# Patient Record
Sex: Female | Born: 1985 | Race: White | Hispanic: No | Marital: Single | Smoking: Former smoker
Health system: Southern US, Community
[De-identification: ages and names within clinical notes are randomized; demographics above are authoritative.]

## PROBLEM LIST (undated history)

## (undated) ENCOUNTER — Inpatient Hospital Stay (HOSPITAL_COMMUNITY): Payer: Self-pay

## (undated) DIAGNOSIS — Z8619 Personal history of other infectious and parasitic diseases: Secondary | ICD-10-CM

## (undated) DIAGNOSIS — F17200 Nicotine dependence, unspecified, uncomplicated: Secondary | ICD-10-CM

## (undated) DIAGNOSIS — IMO0002 Reserved for concepts with insufficient information to code with codable children: Secondary | ICD-10-CM

## (undated) DIAGNOSIS — A630 Anogenital (venereal) warts: Secondary | ICD-10-CM

## (undated) DIAGNOSIS — F410 Panic disorder [episodic paroxysmal anxiety] without agoraphobia: Secondary | ICD-10-CM

## (undated) DIAGNOSIS — R87619 Unspecified abnormal cytological findings in specimens from cervix uteri: Secondary | ICD-10-CM

## (undated) HISTORY — DX: Nicotine dependence, unspecified, uncomplicated: F17.200

## (undated) HISTORY — DX: Reserved for concepts with insufficient information to code with codable children: IMO0002

## (undated) HISTORY — PX: CRYOTHERAPY: SHX1416

## (undated) HISTORY — DX: Unspecified abnormal cytological findings in specimens from cervix uteri: R87.619

## (undated) HISTORY — PX: MANDIBLE SURGERY: SHX707

## (undated) HISTORY — DX: Personal history of other infectious and parasitic diseases: Z86.19

## (undated) HISTORY — DX: Anogenital (venereal) warts: A63.0

---
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
## 2005-01-28 ENCOUNTER — Emergency Department (HOSPITAL_COMMUNITY): Admission: EM | Admit: 2005-01-28 | Discharge: 2005-01-28 | Payer: Self-pay | Admitting: Emergency Medicine

## 2005-11-14 ENCOUNTER — Emergency Department (HOSPITAL_COMMUNITY): Admission: EM | Admit: 2005-11-14 | Discharge: 2005-11-14 | Payer: Self-pay | Admitting: Emergency Medicine

## 2006-10-16 ENCOUNTER — Other Ambulatory Visit: Admission: RE | Admit: 2006-10-16 | Discharge: 2006-10-16 | Payer: Self-pay | Admitting: Family Medicine

## 2006-10-16 ENCOUNTER — Ambulatory Visit: Payer: Self-pay | Admitting: Family Medicine

## 2006-10-21 ENCOUNTER — Ambulatory Visit: Payer: Self-pay | Admitting: Family Medicine

## 2006-11-04 ENCOUNTER — Ambulatory Visit: Payer: Self-pay | Admitting: Family Medicine

## 2007-01-09 ENCOUNTER — Ambulatory Visit: Payer: Self-pay | Admitting: Family Medicine

## 2007-03-11 ENCOUNTER — Ambulatory Visit: Payer: Self-pay | Admitting: Family Medicine

## 2007-09-10 ENCOUNTER — Ambulatory Visit: Payer: Self-pay | Admitting: Family Medicine

## 2008-11-26 ENCOUNTER — Emergency Department (HOSPITAL_COMMUNITY): Admission: EM | Admit: 2008-11-26 | Discharge: 2008-11-27 | Payer: Self-pay | Admitting: Emergency Medicine

## 2008-11-29 ENCOUNTER — Ambulatory Visit: Payer: Self-pay | Admitting: Family Medicine

## 2008-12-06 ENCOUNTER — Ambulatory Visit: Payer: Self-pay | Admitting: Family Medicine

## 2009-01-05 ENCOUNTER — Ambulatory Visit: Payer: Self-pay | Admitting: Family Medicine

## 2009-06-08 ENCOUNTER — Emergency Department (HOSPITAL_COMMUNITY): Admission: EM | Admit: 2009-06-08 | Discharge: 2009-06-08 | Payer: Self-pay | Admitting: Emergency Medicine

## 2009-06-28 ENCOUNTER — Ambulatory Visit: Payer: Self-pay | Admitting: Family Medicine

## 2009-07-24 ENCOUNTER — Ambulatory Visit: Payer: Self-pay | Admitting: Physician Assistant

## 2009-08-28 ENCOUNTER — Inpatient Hospital Stay (HOSPITAL_COMMUNITY): Admission: AD | Admit: 2009-08-28 | Discharge: 2009-08-28 | Payer: Self-pay | Admitting: Obstetrics and Gynecology

## 2010-01-29 ENCOUNTER — Inpatient Hospital Stay (HOSPITAL_COMMUNITY): Admission: AD | Admit: 2010-01-29 | Discharge: 2010-01-29 | Payer: Self-pay | Admitting: Obstetrics and Gynecology

## 2010-02-03 ENCOUNTER — Inpatient Hospital Stay (HOSPITAL_COMMUNITY): Admission: AD | Admit: 2010-02-03 | Discharge: 2010-02-03 | Payer: Self-pay | Admitting: Obstetrics and Gynecology

## 2010-02-25 ENCOUNTER — Inpatient Hospital Stay (HOSPITAL_COMMUNITY): Admission: AD | Admit: 2010-02-25 | Discharge: 2010-02-26 | Payer: Self-pay | Admitting: Obstetrics and Gynecology

## 2010-02-26 ENCOUNTER — Inpatient Hospital Stay (HOSPITAL_COMMUNITY): Admission: AD | Admit: 2010-02-26 | Discharge: 2010-02-28 | Payer: Self-pay | Admitting: Obstetrics and Gynecology

## 2010-02-26 ENCOUNTER — Encounter (INDEPENDENT_AMBULATORY_CARE_PROVIDER_SITE_OTHER): Payer: Self-pay | Admitting: Obstetrics and Gynecology

## 2010-07-25 LAB — CBC
HCT: 31.7 % — ABNORMAL LOW (ref 36.0–46.0)
HCT: 36.2 % (ref 36.0–46.0)
Hemoglobin: 11.9 g/dL — ABNORMAL LOW (ref 12.0–15.0)
Hemoglobin: 12.4 g/dL (ref 12.0–15.0)
MCH: 33 pg (ref 26.0–34.0)
MCH: 33 pg (ref 26.0–34.0)
MCHC: 34.4 g/dL (ref 30.0–36.0)
MCV: 96 fL (ref 78.0–100.0)
MCV: 96.4 fL (ref 78.0–100.0)
MCV: 97.1 fL (ref 78.0–100.0)
Platelets: 107 10*3/uL — ABNORMAL LOW (ref 150–400)
RBC: 3.59 MIL/uL — ABNORMAL LOW (ref 3.87–5.11)
RBC: 3.77 MIL/uL — ABNORMAL LOW (ref 3.87–5.11)
RDW: 13.6 % (ref 11.5–15.5)
RDW: 14 % (ref 11.5–15.5)
WBC: 12.2 10*3/uL — ABNORMAL HIGH (ref 4.0–10.5)
WBC: 16.1 10*3/uL — ABNORMAL HIGH (ref 4.0–10.5)

## 2010-07-29 LAB — URINE MICROSCOPIC-ADD ON

## 2010-07-29 LAB — URINALYSIS, ROUTINE W REFLEX MICROSCOPIC
Bilirubin Urine: NEGATIVE
Glucose, UA: NEGATIVE mg/dL
Specific Gravity, Urine: 1.014 (ref 1.005–1.030)
Urobilinogen, UA: 0.2 mg/dL (ref 0.0–1.0)
pH: 6 (ref 5.0–8.0)

## 2010-07-29 LAB — POCT PREGNANCY, URINE: Preg Test, Ur: NEGATIVE

## 2010-07-31 LAB — WET PREP, GENITAL
Trich, Wet Prep: NONE SEEN
Yeast Wet Prep HPF POC: NONE SEEN

## 2010-07-31 LAB — URINALYSIS, ROUTINE W REFLEX MICROSCOPIC
Ketones, ur: NEGATIVE mg/dL
Leukocytes, UA: NEGATIVE
Nitrite: NEGATIVE
Protein, ur: NEGATIVE mg/dL
Urobilinogen, UA: 0.2 mg/dL (ref 0.0–1.0)

## 2010-07-31 LAB — GC/CHLAMYDIA PROBE AMP, GENITAL: GC Probe Amp, Genital: NEGATIVE

## 2010-07-31 LAB — CBC
HCT: 37.1 % (ref 36.0–46.0)
Hemoglobin: 12.7 g/dL (ref 12.0–15.0)
MCHC: 34.2 g/dL (ref 30.0–36.0)
RDW: 13.2 % (ref 11.5–15.5)

## 2010-07-31 LAB — URINE CULTURE

## 2011-05-14 NOTE — L&D Delivery Note (Signed)
After admission pt rapidly progressed to C/C/+2.  She pushed briefly and had a SVD of one live viable white female infant over intact perineum in ROA position. Placenta S/I. EBL - 400cc. Baby to NBN.

## 2011-06-06 ENCOUNTER — Encounter: Payer: Self-pay | Admitting: Internal Medicine

## 2011-06-07 ENCOUNTER — Ambulatory Visit: Payer: Self-pay | Admitting: Medical

## 2011-06-17 ENCOUNTER — Other Ambulatory Visit: Payer: Self-pay | Admitting: Obstetrics and Gynecology

## 2011-06-17 LAB — OB RESULTS CONSOLE GC/CHLAMYDIA
Chlamydia: NEGATIVE
Gonorrhea: NEGATIVE

## 2011-06-17 LAB — OB RESULTS CONSOLE HEPATITIS B SURFACE ANTIGEN: Hepatitis B Surface Ag: NEGATIVE

## 2011-06-17 LAB — OB RESULTS CONSOLE RPR: RPR: NONREACTIVE

## 2011-06-17 LAB — OB RESULTS CONSOLE RUBELLA ANTIBODY, IGM: Rubella: IMMUNE

## 2011-11-09 ENCOUNTER — Inpatient Hospital Stay (HOSPITAL_COMMUNITY)
Admission: AD | Admit: 2011-11-09 | Discharge: 2011-11-09 | Disposition: A | Discharge status: Home / Self Care | Payer: Medicaid Other | Source: Ambulatory Visit | Attending: Obstetrics & Gynecology | Admitting: Obstetrics & Gynecology

## 2011-11-09 ENCOUNTER — Encounter (HOSPITAL_COMMUNITY): Payer: Self-pay

## 2011-11-09 DIAGNOSIS — O99891 Other specified diseases and conditions complicating pregnancy: Secondary | ICD-10-CM | POA: Insufficient documentation

## 2011-11-09 DIAGNOSIS — N949 Unspecified condition associated with female genital organs and menstrual cycle: Secondary | ICD-10-CM | POA: Insufficient documentation

## 2011-11-09 DIAGNOSIS — O26899 Other specified pregnancy related conditions, unspecified trimester: Secondary | ICD-10-CM

## 2011-11-09 DIAGNOSIS — N898 Other specified noninflammatory disorders of vagina: Secondary | ICD-10-CM

## 2011-11-09 LAB — POCT FERN TEST: Fern Test: NEGATIVE

## 2011-11-09 NOTE — Discharge Instructions (Signed)
The exam we did to check for rupture of membranes was negative. I spoke with Dr. Arlyce Dice. We feel that you may go home but should rest and follow up in the office. If you have another episode of leaking fluid you should call Dr. Arlyce Dice or return here. If you have contractions, fever, dizziness or other problems, return.

## 2011-11-09 NOTE — MAU Note (Signed)
Had a gush of fld about 5-6hrs ago and has happened twice now. Fld was clear. Has had occ. Pain in lower abd that comes and goes but does not feel like a contracton.

## 2011-11-09 NOTE — MAU Provider Note (Signed)
SENG FOUTS is a 26 y.o. G3, P1102 @ [redacted]w[redacted]d gestation who presents to MAU for ? ROM. She reports leaking fluid approximately 5 hours prior to arrival to MAU. Happened  2 separate occasions. Last sexual intercourse earlier today.   Exam: 26 y.o. female in no acute distress, alert and oriented. Spec. Exam: white discharge vaginal vault. No pooling noted.   BP 139/56  Pulse 95  Temp 98.5 F (36.9 C) (Oral)  Resp 20  Ht 5\' 1"  (1.549 m)  Wt 176 lb 3.2 oz (79.924 kg)  BMI 33.29 kg/m2  EFM: Baseline FH 145, no decelerations, reactive tracing, no contractions.   Slide obtained to examine for fern and is negative.  I discussed the clinical and lab findings with Dr. Arlyce Dice.   We will discharge the patient home and if she has any problems she will call Dr. Arlyce Dice.   I have reviewed this patient's vital signs, nurses notes and appropriate labs. I have discussed with the patient and her family results of the fern slide and the fact that this does not indicate rupture of membranes. I discussed with the patient need for pelvic rest. If she has another episode of leaking fluid, contractions or any problems she will call Dr. Arlyce Dice or return here. Written instructions given to the patient.

## 2011-11-09 NOTE — MAU Note (Signed)
FHM strip reviewed

## 2011-12-11 ENCOUNTER — Telehealth (HOSPITAL_COMMUNITY): Payer: Self-pay | Admitting: *Deleted

## 2011-12-11 ENCOUNTER — Encounter (HOSPITAL_COMMUNITY): Payer: Self-pay | Admitting: *Deleted

## 2011-12-11 NOTE — Telephone Encounter (Signed)
Preadmission screenPreadmission screen 

## 2011-12-27 ENCOUNTER — Encounter (HOSPITAL_COMMUNITY): Payer: Self-pay | Admitting: *Deleted

## 2011-12-27 ENCOUNTER — Encounter (HOSPITAL_COMMUNITY): Payer: Self-pay | Admitting: Anesthesiology

## 2011-12-27 ENCOUNTER — Inpatient Hospital Stay (HOSPITAL_COMMUNITY): Payer: Medicaid Other | Admitting: Anesthesiology

## 2011-12-27 ENCOUNTER — Inpatient Hospital Stay (HOSPITAL_COMMUNITY)
Admission: AD | Admit: 2011-12-27 | Discharge: 2011-12-29 | DRG: 775 | Disposition: A | Discharge status: Home / Self Care | Payer: Medicaid Other | Source: Ambulatory Visit | Attending: Obstetrics and Gynecology | Admitting: Obstetrics and Gynecology

## 2011-12-27 DIAGNOSIS — Z3480 Encounter for supervision of other normal pregnancy, unspecified trimester: Secondary | ICD-10-CM

## 2011-12-27 LAB — CBC
HCT: 33.1 % — ABNORMAL LOW (ref 36.0–46.0)
MCV: 89.7 fL (ref 78.0–100.0)
RBC: 3.69 MIL/uL — ABNORMAL LOW (ref 3.87–5.11)
WBC: 11.6 10*3/uL — ABNORMAL HIGH (ref 4.0–10.5)

## 2011-12-27 MED ORDER — EPHEDRINE 5 MG/ML INJ
10.0000 mg | INTRAVENOUS | Status: DC | PRN
  Filled 2011-12-27: qty 4

## 2011-12-27 MED ORDER — EPHEDRINE 5 MG/ML INJ: 10.0000 mg | INTRAVENOUS | Status: DC | PRN

## 2011-12-27 MED ORDER — LIDOCAINE HCL (PF) 1 % IJ SOLN
30.0000 mL | INTRAMUSCULAR | Status: DC | PRN
  Filled 2011-12-27: qty 30

## 2011-12-27 MED ORDER — SIMETHICONE 80 MG PO CHEW: 80.0000 mg | CHEWABLE_TABLET | ORAL | Status: DC | PRN

## 2011-12-27 MED ORDER — ONDANSETRON HCL 4 MG PO TABS: 4.0000 mg | ORAL_TABLET | ORAL | Status: DC | PRN

## 2011-12-27 MED ORDER — DIPHENHYDRAMINE HCL 50 MG/ML IJ SOLN: 12.5000 mg | INTRAMUSCULAR | Status: DC | PRN

## 2011-12-27 MED ORDER — OXYCODONE-ACETAMINOPHEN 5-325 MG PO TABS
1.0000 | ORAL_TABLET | ORAL | Status: DC | PRN
  Administered 2011-12-27: 1 via ORAL
  Filled 2011-12-27: qty 1

## 2011-12-27 MED ORDER — OXYTOCIN 40 UNITS IN LACTATED RINGERS INFUSION - SIMPLE MED
62.5000 mL/h | Freq: Once | INTRAVENOUS | Status: AC
  Administered 2011-12-27: 62.5 mL/h via INTRAVENOUS
  Filled 2011-12-27: qty 1000

## 2011-12-27 MED ORDER — OXYCODONE-ACETAMINOPHEN 5-325 MG PO TABS
1.0000 | ORAL_TABLET | ORAL | Status: DC | PRN
  Administered 2011-12-28 (×5): 1 via ORAL
  Filled 2011-12-27 (×7): qty 1

## 2011-12-27 MED ORDER — ONDANSETRON HCL 4 MG/2ML IJ SOLN: 4.0000 mg | INTRAMUSCULAR | Status: DC | PRN

## 2011-12-27 MED ORDER — ONDANSETRON HCL 4 MG/2ML IJ SOLN: 4.0000 mg | Freq: Four times a day (QID) | INTRAMUSCULAR | Status: DC | PRN

## 2011-12-27 MED ORDER — MEASLES, MUMPS & RUBELLA VAC ~~LOC~~ INJ: 0.5000 mL | INJECTION | Freq: Once | SUBCUTANEOUS | Status: DC

## 2011-12-27 MED ORDER — ACETAMINOPHEN 325 MG PO TABS: 650.0000 mg | ORAL_TABLET | ORAL | Status: DC | PRN

## 2011-12-27 MED ORDER — ZOLPIDEM TARTRATE 5 MG PO TABS: 5.0000 mg | ORAL_TABLET | Freq: Every evening | ORAL | Status: DC | PRN

## 2011-12-27 MED ORDER — PHENYLEPHRINE 40 MCG/ML (10ML) SYRINGE FOR IV PUSH (FOR BLOOD PRESSURE SUPPORT): 80.0000 ug | PREFILLED_SYRINGE | INTRAVENOUS | Status: DC | PRN

## 2011-12-27 MED ORDER — FENTANYL 2.5 MCG/ML BUPIVACAINE 1/10 % EPIDURAL INFUSION (WH - ANES)
14.0000 mL/h | INTRAMUSCULAR | Status: DC
  Administered 2011-12-27: 14 mL/h via EPIDURAL
  Filled 2011-12-27: qty 60

## 2011-12-27 MED ORDER — IBUPROFEN 600 MG PO TABS
600.0000 mg | ORAL_TABLET | Freq: Four times a day (QID) | ORAL | Status: DC | PRN
  Administered 2011-12-27: 600 mg via ORAL
  Filled 2011-12-27: qty 1

## 2011-12-27 MED ORDER — WITCH HAZEL-GLYCERIN EX PADS: 1.0000 "application " | MEDICATED_PAD | CUTANEOUS | Status: DC | PRN

## 2011-12-27 MED ORDER — LACTATED RINGERS IV SOLN: 500.0000 mL | Freq: Once | INTRAVENOUS | Status: DC

## 2011-12-27 MED ORDER — IBUPROFEN 600 MG PO TABS
600.0000 mg | ORAL_TABLET | Freq: Four times a day (QID) | ORAL | Status: DC
  Administered 2011-12-28 – 2011-12-29 (×6): 600 mg via ORAL
  Filled 2011-12-27 (×6): qty 1

## 2011-12-27 MED ORDER — FLEET ENEMA 7-19 GM/118ML RE ENEM: 1.0000 | ENEMA | RECTAL | Status: DC | PRN

## 2011-12-27 MED ORDER — BENZOCAINE-MENTHOL 20-0.5 % EX AERO: 1.0000 "application " | INHALATION_SPRAY | CUTANEOUS | Status: DC | PRN

## 2011-12-27 MED ORDER — TETANUS-DIPHTH-ACELL PERTUSSIS 5-2.5-18.5 LF-MCG/0.5 IM SUSP
0.5000 mL | Freq: Once | INTRAMUSCULAR | Status: AC
  Administered 2011-12-28: 0.5 mL via INTRAMUSCULAR
  Filled 2011-12-27: qty 0.5

## 2011-12-27 MED ORDER — LIDOCAINE HCL (PF) 1 % IJ SOLN
INTRAMUSCULAR | Status: DC | PRN
  Administered 2011-12-27 (×2): 5 mL

## 2011-12-27 MED ORDER — PHENYLEPHRINE 40 MCG/ML (10ML) SYRINGE FOR IV PUSH (FOR BLOOD PRESSURE SUPPORT)
80.0000 ug | PREFILLED_SYRINGE | INTRAVENOUS | Status: DC | PRN
  Filled 2011-12-27: qty 5

## 2011-12-27 MED ORDER — CITRIC ACID-SODIUM CITRATE 334-500 MG/5ML PO SOLN: 30.0000 mL | ORAL | Status: DC | PRN

## 2011-12-27 MED ORDER — LACTATED RINGERS IV SOLN
INTRAVENOUS | Status: DC
  Administered 2011-12-27: 19:00:00 via INTRAVENOUS

## 2011-12-27 MED ORDER — DIBUCAINE 1 % RE OINT: 1.0000 "application " | TOPICAL_OINTMENT | RECTAL | Status: DC | PRN

## 2011-12-27 MED ORDER — LACTATED RINGERS IV SOLN
500.0000 mL | INTRAVENOUS | Status: DC | PRN
Start: 2011-12-27 — End: 2011-12-27

## 2011-12-27 MED ORDER — OXYTOCIN BOLUS FROM INFUSION
250.0000 mL | Freq: Once | INTRAVENOUS | Status: DC
  Filled 2011-12-27: qty 500

## 2011-12-27 NOTE — Anesthesia Procedure Notes (Signed)
Epidural Patient location during procedure: OB Start time: 12/27/2011 8:11 PM  Staffing Anesthesiologist: Brayton Caves R Performed by: anesthesiologist   Preanesthetic Checklist Completed: patient identified, site marked, surgical consent, pre-op evaluation, timeout performed, IV checked, risks and benefits discussed and monitors and equipment checked  Epidural Patient position: sitting Prep: site prepped and draped and DuraPrep Patient monitoring: continuous pulse ox and blood pressure Approach: midline Injection technique: LOR air and LOR saline  Needle:  Needle type: Tuohy  Needle gauge: 17 G Needle length: 9 cm Needle insertion depth: 6 cm Catheter type: closed end flexible Catheter size: 19 Gauge Catheter at skin depth: 11 cm Test dose: negative  Assessment Events: blood not aspirated, injection not painful, no injection resistance, negative IV test and no paresthesia  Additional Notes Patient identified.  Risk benefits discussed including failed block, incomplete pain control, headache, nerve damage, paralysis, blood pressure changes, nausea, vomiting, reactions to medication both toxic or allergic, and postpartum back pain.  Patient expressed understanding and wished to proceed.  All questions were answered.  Sterile technique used throughout procedure and epidural site dressed with sterile barrier dressing. No paresthesia or other complications noted.The patient did not experience any signs of intravascular injection such as tinnitus or metallic taste in mouth nor signs of intrathecal spread such as rapid motor block. Please see nursing notes for vital signs.

## 2011-12-27 NOTE — Anesthesia Preprocedure Evaluation (Signed)

## 2011-12-27 NOTE — MAU Note (Signed)
Dr. Dareen Piano notified pt's cervix now 6cm, orders to admit, gbs negative, may have epidural.

## 2011-12-27 NOTE — H&P (Signed)
Pt is a 25 year old white female, Nancy Palmer, at term who presents to the hospital in labor. On admission pt was 4cm. PNC was uncomplicated.  PMHx: Please read [facility] PE: VSSAF        HEENT- wnl        ABD- gravid IMP/ IUP at term         Labor Plan/ Admit

## 2011-12-27 NOTE — MAU Note (Signed)
Patient states she is having contractions every 5-10 minutes with no bleeding or leaking.

## 2011-12-27 NOTE — MAU Note (Signed)
Pt states, " I've had regular contractions since 3 pm, and they are every five to ten minutes."

## 2011-12-28 LAB — CBC
MCH: 28.8 pg (ref 26.0–34.0)
MCV: 90.3 fL (ref 78.0–100.0)
Platelets: 135 10*3/uL — ABNORMAL LOW (ref 150–400)
RDW: 14.7 % (ref 11.5–15.5)

## 2011-12-28 NOTE — Anesthesia Postprocedure Evaluation (Signed)
  Anesthesia Post-op Note  Patient: Nancy Palmer  Procedure(s) Performed: * No procedures listed *  Patient Location: Mother/Baby  Anesthesia Type: Epidural  Level of Consciousness: awake, alert  and oriented  Airway and Oxygen Therapy: Patient Spontanous Breathing  Post-op Pain: mild  Post-op Assessment: Patient's Cardiovascular Status Stable, Respiratory Function Stable, Patent Airway and No signs of Nausea or vomiting  Post-op Vital Signs: stable  Complications: No apparent anesthesia complications

## 2011-12-28 NOTE — Progress Notes (Signed)
PPD#1 Pt without complaints. Lochia- mild. VSSAF IMP/ doing well PLAN/ Discharge in am.

## 2011-12-29 MED ORDER — IBUPROFEN 600 MG PO TABS: 600.0000 mg | ORAL_TABLET | Freq: Four times a day (QID) | ORAL | Status: AC | PRN

## 2011-12-29 MED ORDER — OXYCODONE-ACETAMINOPHEN 5-325 MG PO TABS: 1.0000 | ORAL_TABLET | ORAL | Status: AC | PRN

## 2011-12-29 NOTE — Discharge Summary (Signed)
Obstetric Discharge Summary Reason for Admission: onset of labor Prenatal Procedures: ultrasound Intrapartum Procedures: spontaneous vaginal delivery Postpartum Procedures: none Complications-Operative and Postpartum: none Hemoglobin  Date Value Range Status  12/28/2011 10.1* 12.0 - 15.0 g/dL Final     HCT  Date Value Range Status  12/28/2011 31.7* 36.0 - 46.0 % Final    Physical Exam:  General: alert Lochia: appropriate Uterine Fundus: firm  Discharge Diagnoses: Term Pregnancy-delivered  Discharge Information: Date: 12/29/2011 Activity: pelvic rest Diet: routine Medications: PNV, Ibuprofen and Percocet Condition: stable Instructions: refer to practice specific booklet Discharge to: home Follow-up Information    Follow up with Levi Aland, MD. Schedule an appointment as soon as possible for a visit in 4 weeks.   Contact information:   [address] [place] [address] [phone]          Newborn Data: Live born female  Birth Weight: 6 lb 13.9 oz (3115 g) APGAR: 9, 9  Home with mother.  Jannine Abreu E 12/29/2011, 10:25 AM

## 2011-12-30 NOTE — Progress Notes (Signed)
Post discharge chart review completed.  

## 2012-08-08 ENCOUNTER — Encounter (HOSPITAL_BASED_OUTPATIENT_CLINIC_OR_DEPARTMENT_OTHER): Payer: Self-pay | Admitting: *Deleted

## 2012-08-08 ENCOUNTER — Emergency Department (HOSPITAL_BASED_OUTPATIENT_CLINIC_OR_DEPARTMENT_OTHER)
Admission: EM | Admit: 2012-08-08 | Discharge: 2012-08-08 | Disposition: A | Discharge status: Home / Self Care | Payer: Self-pay | Attending: Emergency Medicine | Admitting: Emergency Medicine

## 2012-08-08 DIAGNOSIS — Y9389 Activity, other specified: Secondary | ICD-10-CM | POA: Insufficient documentation

## 2012-08-08 DIAGNOSIS — Y929 Unspecified place or not applicable: Secondary | ICD-10-CM | POA: Insufficient documentation

## 2012-08-08 DIAGNOSIS — S61219A Laceration without foreign body of unspecified finger without damage to nail, initial encounter: Secondary | ICD-10-CM

## 2012-08-08 DIAGNOSIS — W260XXA Contact with knife, initial encounter: Secondary | ICD-10-CM | POA: Insufficient documentation

## 2012-08-08 DIAGNOSIS — S61209A Unspecified open wound of unspecified finger without damage to nail, initial encounter: Secondary | ICD-10-CM | POA: Insufficient documentation

## 2012-08-08 DIAGNOSIS — Z8619 Personal history of other infectious and parasitic diseases: Secondary | ICD-10-CM | POA: Insufficient documentation

## 2012-08-08 DIAGNOSIS — Z87891 Personal history of nicotine dependence: Secondary | ICD-10-CM | POA: Insufficient documentation

## 2012-08-08 NOTE — ED Notes (Signed)
Pt states she cut her left middle finger with a knife last p.m. Bleeding controlled.

## 2012-08-08 NOTE — ED Provider Notes (Signed)
History     CSN: [id number]  Arrival date & time 08/08/12  1332   First MD Initiated Contact with Patient 08/08/12 1555      Chief Complaint  Patient presents with  . Laceration    (Consider location/radiation/quality/duration/timing/severity/associated sxs/prior treatment) Patient is a 27 y.o. female presenting with hand pain. The history is provided by the patient. No language interpreter was used.  Hand Pain This is a new problem. The current episode started yesterday. The problem occurs constantly. The problem has been gradually worsening. Nothing aggravates the symptoms. She has tried nothing for the symptoms. The treatment provided no relief.  Pt cut finger with a knife last night.  Pt reports area began bleeding again today.  Past Medical History  Diagnosis Date  . Smoker   . No pertinent past medical history   . H/O varicella   . HPV (human papilloma virus) anogenital infection   . Abnormal Pap smear     Past Surgical History  Procedure Laterality Date  . Mandible surgery      Family History  Problem Relation Age of Onset  . Other Neg Hx   . COPD Maternal Grandmother   . Diabetes Paternal Grandmother   . Heart disease Father   . COPD Paternal Grandfather     History  Substance Use Topics  . Smoking status: Former Smoker    Quit date: 12/11/2010  . Smokeless tobacco: Never Used  . Alcohol Use: No    OB History   Grav Para Term Preterm Abortions TAB SAB Ect Mult Living   3 3 2 1      3       Review of Systems  Skin: Positive for wound.    Allergies  Penicillins  Home Medications  No current outpatient prescriptions on file.  BP 125/81  Pulse 75  Temp(Src) 98.2 F (36.8 C) (Oral)  Resp 16  Ht 5\' 1"  (1.549 m)  Wt 155 lb (70.308 kg)  BMI 29.3 kg/m2  SpO2 100%  LMP 07/25/2012  Breastfeeding? No  Physical Exam  Nursing note and vitals reviewed. Constitutional: She is oriented to person, place, and time. She appears well-developed and  well-nourished.  Musculoskeletal:  Left 3rd finger 7mm laceration, no bleeding  nv and ns intact  Neurological: She is alert and oriented to person, place, and time. She has normal reflexes.  Skin: Skin is warm.  Psychiatric: She has a normal mood and affect.    ED Course  Procedures (including critical care time)  Labs Reviewed - No data to display No results found.   1. Laceration of finger of left hand, initial encounter       MDM  Steri strips placed to laceration,   Pt counseled on wound care        Elson Areas, PA-C 08/08/12 1634

## 2012-08-09 NOTE — ED Provider Notes (Signed)
Medical screening examination/treatment/procedure(s) were performed by non-physician practitioner and as supervising physician I was immediately available for consultation/collaboration.   Dashayla Theissen III, MD 08/09/12 1315 

## 2013-04-18 ENCOUNTER — Encounter (HOSPITAL_COMMUNITY): Payer: Self-pay | Admitting: *Deleted

## 2013-04-18 ENCOUNTER — Inpatient Hospital Stay (HOSPITAL_COMMUNITY)
Admission: AD | Admit: 2013-04-18 | Discharge: 2013-04-18 | Disposition: A | Discharge status: Home / Self Care | Payer: Medicaid Other | Source: Ambulatory Visit | Attending: Obstetrics & Gynecology | Admitting: Obstetrics & Gynecology

## 2013-04-18 DIAGNOSIS — O210 Mild hyperemesis gravidarum: Secondary | ICD-10-CM

## 2013-04-18 DIAGNOSIS — R109 Unspecified abdominal pain: Secondary | ICD-10-CM | POA: Insufficient documentation

## 2013-04-18 DIAGNOSIS — O219 Vomiting of pregnancy, unspecified: Secondary | ICD-10-CM

## 2013-04-18 HISTORY — DX: Panic disorder (episodic paroxysmal anxiety): F41.0

## 2013-04-18 LAB — URINALYSIS, ROUTINE W REFLEX MICROSCOPIC
Bilirubin Urine: NEGATIVE
Ketones, ur: 15 mg/dL — AB
Leukocytes, UA: NEGATIVE
Nitrite: NEGATIVE
Protein, ur: NEGATIVE mg/dL
Urobilinogen, UA: 1 mg/dL (ref 0.0–1.0)

## 2013-04-18 MED ORDER — ACETAMINOPHEN 325 MG PO TABS
650.0000 mg | ORAL_TABLET | Freq: Once | ORAL | Status: AC
  Administered 2013-04-18: 650 mg via ORAL
  Filled 2013-04-18: qty 2

## 2013-04-18 MED ORDER — PROMETHAZINE HCL 25 MG PO TABS: 25.0000 mg | ORAL_TABLET | Freq: Four times a day (QID) | ORAL | Status: DC | PRN

## 2013-04-18 MED ORDER — ONDANSETRON 8 MG PO TBDP
8.0000 mg | ORAL_TABLET | Freq: Once | ORAL | Status: AC
  Administered 2013-04-18: 8 mg via ORAL
  Filled 2013-04-18: qty 1

## 2013-04-18 NOTE — MAU Note (Signed)
Woke up this morning nauseated which is not unusual. I threw up once this am and had diarrhea once this am. Having aching in my hips

## 2013-04-18 NOTE — MAU Provider Note (Signed)
History     CSN: [id number]  Arrival date and time: 04/18/13 4098   First Provider Initiated Contact with Patient 04/18/13 0050      Chief Complaint  Patient presents with  . Abdominal Cramping   HPI Nancy Palmer 27 y.o. [redacted]w[redacted]d   Comes to MAU as she has been having continuous nausea.  Today she vomited x one.  She is having pain in the sides of her hips that goes to her lower abdomen.  Denies any leaking of fluid or vaginal bleeding.  Had diarrhea x one on Saturday.  Has not yet started prenatal care.  Is feeling weak due to the nausea.  OB History   Grav Para Term Preterm Abortions TAB SAB Ect Mult Living   4 3 2 1      3       Past Medical History  Diagnosis Date  . Smoker   . No pertinent past medical history   . H/O varicella   . HPV (human papilloma virus) anogenital infection   . Abnormal Pap smear   . Panic attack     Past Surgical History  Procedure Laterality Date  . Mandible surgery      Family History  Problem Relation Age of Onset  . Other Neg Hx   . COPD Maternal Grandmother   . Diabetes Paternal Grandmother   . Heart disease Father   . COPD Paternal Grandfather     History  Substance Use Topics  . Smoking status: Former Smoker    Quit date: 12/11/2010  . Smokeless tobacco: Never Used  . Alcohol Use: No    Allergies:  Allergies  Allergen Reactions  . Penicillins     Childhood reaction, unknown    Prescriptions prior to admission  Medication Sig Dispense Refill  . ibuprofen (ADVIL,MOTRIN) 200 MG tablet Take 200 mg by mouth every 6 (six) hours as needed.        Review of Systems  Constitutional: Negative for fever.  Gastrointestinal: Positive for nausea, vomiting, abdominal pain and diarrhea. Negative for constipation.  Genitourinary:       No vaginal discharge. No vaginal bleeding. No dysuria.   Physical Exam   Blood pressure 139/67, pulse 97, temperature 98.3 F (36.8 C), resp. rate 18, height 5\' 1"  (1.549 m), weight 180 lb 3.2  oz (81.738 kg), last menstrual period 01/30/2013.  Physical Exam  Nursing note and vitals reviewed. Constitutional: She is oriented to person, place, and time. She appears well-developed and well-nourished.  HENT:  Head: Normocephalic.  Eyes: EOM are normal.  Neck: Neck supple.  GI: Soft. There is no tenderness. There is no rebound and no guarding.  FHT heard with doppler 170.  Musculoskeletal: Normal range of motion.  No CVA tenderness.  Has some tenderness all across low back and seems ticklish with exam to flank areas, but no true CVA tenderness.  Neurological: She is alert and oriented to person, place, and time.  Skin: Skin is warm and dry.  Psychiatric: She has a normal mood and affect.    MAU Course  Procedures  MDM Results for orders placed during the hospital encounter of 04/18/13 (from the past 24 hour(s))  URINALYSIS, ROUTINE W REFLEX MICROSCOPIC     Status: Abnormal   Collection Time    04/18/13 12:27 AM      Result Value Range   Color, Urine YELLOW  YELLOW   APPearance CLOUDY (*) CLEAR   Specific Gravity, Urine >1.030 (*) 1.005 - 1.030  pH 6.0  5.0 - 8.0   Glucose, UA NEGATIVE  NEGATIVE mg/dL   Hgb urine dipstick NEGATIVE  NEGATIVE   Bilirubin Urine NEGATIVE  NEGATIVE   Ketones, ur 15 (*) NEGATIVE mg/dL   Protein, ur NEGATIVE  NEGATIVE mg/dL   Urobilinogen, UA 1.0  0.0 - 1.0 mg/dL   Nitrite NEGATIVE  NEGATIVE   Leukocytes, UA NEGATIVE  NEGATIVE  POCT PREGNANCY, URINE     Status: Abnormal   Collection Time    04/18/13 12:30 AM      Result Value Range   Preg Test, Ur POSITIVE (*) NEGATIVE    Assessment and Plan  Nausea and vomiting in pregnancy  Plan Zofran 8 mg ODT in MAU Will prescribe Phenergan 25 mg One po q 6 hours as needed for nausea. Take Tylenol 325 mg 2 tablets by mouth every 4 hours if needed for pain. Drink at least 8 8-oz glasses of water every day.   Kharter Brew 04/18/2013, 12:55 AM

## 2013-05-13 NOTE — L&D Delivery Note (Signed)
Delivery Note Called for Urgent Delivery due to fetal descent and FHR decels. [name] ApplPinn was involved in an emergency delivery at the time.    Upon entry to room, FHR was in 70s with recovery between contractions. Small amount of crowning visible.    At 1:10 PM a viable and healthy female was delivered via Vaginal, Spontaneous Delivery (Presentation: Middle Occiput Anterior).   No difficulty with shoulders was encountered. APGAR: 9, 9; weight TBD.   Placenta status: Intact, Spontaneous.   Cord: 3 vessels with the following complications: None.    Anesthesia: Epidural  Episiotomy: None Lacerations: None Suture Repair: None required Est. Blood Loss (mL): 150  Mom to postpartum.  Baby to Couplet care / Skin to Skin.  Wynelle BourgeoisWILLIAMS,MARIE 11/01/2013, 1:50 PM

## 2013-07-09 ENCOUNTER — Other Ambulatory Visit: Payer: Self-pay | Admitting: Obstetrics and Gynecology

## 2013-07-09 LAB — OB RESULTS CONSOLE GC/CHLAMYDIA
CHLAMYDIA, DNA PROBE: NEGATIVE
Gonorrhea: NEGATIVE

## 2013-07-12 LAB — OB RESULTS CONSOLE HIV ANTIBODY (ROUTINE TESTING): HIV: NONREACTIVE

## 2013-07-12 LAB — OB RESULTS CONSOLE RPR: RPR: NONREACTIVE

## 2013-10-11 LAB — OB RESULTS CONSOLE GBS: STREP GROUP B AG: NEGATIVE

## 2013-10-14 ENCOUNTER — Inpatient Hospital Stay (HOSPITAL_COMMUNITY)
Admission: AD | Admit: 2013-10-14 | Discharge: 2013-10-14 | Disposition: A | Discharge status: Home / Self Care | Payer: Medicaid Other | Source: Ambulatory Visit | Attending: Obstetrics and Gynecology | Admitting: Obstetrics and Gynecology

## 2013-10-14 ENCOUNTER — Encounter (HOSPITAL_COMMUNITY): Payer: Self-pay | Admitting: *Deleted

## 2013-10-14 DIAGNOSIS — O4700 False labor before 37 completed weeks of gestation, unspecified trimester: Secondary | ICD-10-CM | POA: Insufficient documentation

## 2013-10-14 MED ORDER — ZOLPIDEM TARTRATE 5 MG PO TABS: 5.0000 mg | ORAL_TABLET | Freq: Once | ORAL | Status: DC

## 2013-10-14 MED ORDER — NIFEDIPINE 10 MG PO CAPS
10.0000 mg | ORAL_CAPSULE | Freq: Once | ORAL | Status: AC
  Administered 2013-10-14: 10 mg via ORAL
  Filled 2013-10-14: qty 1

## 2013-10-14 NOTE — Discharge Instructions (Signed)
Braxton Hicks Contractions Pregnancy is commonly associated with contractions of the uterus throughout the pregnancy. Towards the end of pregnancy (32 to 34 weeks), these contractions (Braxton Hicks) can develop more often and may become more forceful. This is not true labor because these contractions do not result in opening (dilatation) and thinning of the cervix. They are sometimes difficult to tell apart from true labor because these contractions can be forceful and people have different pain tolerances. You should not feel embarrassed if you go to the hospital with false labor. Sometimes, the only way to tell if you are in true labor is for your caregiver to follow the changes in the cervix. How to tell the difference between true and false labor:  False labor.  The contractions of false labor are usually shorter, irregular and not as hard as those of true labor.  They are often felt in the front of the lower abdomen and in the groin.  They may leave with walking around or changing positions while lying down.  They get weaker and are shorter lasting as time goes on.  These contractions are usually irregular.  They do not usually become progressively stronger, regular and closer together as with true labor.  True labor.  Contractions in true labor last 30 to 70 seconds, become very regular, usually become more intense, and increase in frequency.  They do not go away with walking.  The discomfort is usually felt in the top of the uterus and spreads to the lower abdomen and low back.  True labor can be determined by your caregiver with an exam. This will show that the cervix is dilating and getting thinner. If there are no prenatal problems or other health problems associated with the pregnancy, it is completely safe to be sent home with false labor and await the onset of true labor. HOME CARE INSTRUCTIONS   Keep up with your usual exercises and instructions.  Take medications as  directed.  Keep your regular prenatal appointment.  Eat and drink lightly if you think you are going into labor.  If BH contractions are making you uncomfortable:  Change your activity position from lying down or resting to walking/walking to resting.  Sit and rest in a tub of warm water.  Drink 2 to 3 glasses of water. Dehydration may cause B-H contractions.  Do slow and deep breathing several times an hour. SEEK IMMEDIATE MEDICAL CARE IF:   Your contractions continue to become stronger, more regular, and closer together.  You have a gushing, burst or leaking of fluid from the vagina.  An oral temperature above 102 F (38.9 C) develops.  You have passage of blood-tinged mucus.  You develop vaginal bleeding.  You develop continuous belly (abdominal) pain.  You have low back pain that you never had before.  You feel the baby's head pushing down causing pelvic pressure.  The baby is not moving as much as it used to. Document Released: 04/29/2005 Document Revised: 07/22/2011 Document Reviewed: 02/08/2013 [employer] Patient Information 2014 [employer].  Fetal Movement Counts Patient Name: __________________________________________________ Patient Due Date: ____________________ Performing a fetal movement count is highly recommended in high-risk pregnancies, but it is good for every pregnant woman to do. Your caregiver may ask you to start counting fetal movements at 28 weeks of the pregnancy. Fetal movements often increase:  After eating a full meal.  After physical activity.  After eating or drinking something sweet or cold.  At rest. Pay attention to when you feel   the baby is most active. This will help you notice a pattern of your baby's sleep and wake cycles and what factors contribute to an increase in fetal movement. It is important to perform a fetal movement count at the same time each day when your baby is normally most active.  HOW TO COUNT FETAL  MOVEMENTS 1. Find a quiet and comfortable area to sit or lie down on your left side. Lying on your left side provides the best blood and oxygen circulation to your baby. 2. Write down the day and time on a sheet of paper or in a journal. 3. Start counting kicks, flutters, swishes, rolls, or jabs in a 2 hour period. You should feel at least 10 movements within 2 hours. 4. If you do not feel 10 movements in 2 hours, wait 2 3 hours and count again. Look for a change in the pattern or not enough counts in 2 hours. SEEK MEDICAL CARE IF:  You feel less than 10 counts in 2 hours, tried twice.  There is no movement in over an hour.  The pattern is changing or taking longer each day to reach 10 counts in 2 hours.  You feel the baby is not moving as he or she usually does. Date: ____________ Movements: ____________ Start time: ____________ Finish time: ____________  Date: ____________ Movements: ____________ Start time: ____________ Finish time: ____________ Date: ____________ Movements: ____________ Start time: ____________ Finish time: ____________ Date: ____________ Movements: ____________ Start time: ____________ Finish time: ____________ Date: ____________ Movements: ____________ Start time: ____________ Finish time: ____________ Date: ____________ Movements: ____________ Start time: ____________ Finish time: ____________ Date: ____________ Movements: ____________ Start time: ____________ Finish time: ____________ Date: ____________ Movements: ____________ Start time: ____________ Finish time: ____________  Date: ____________ Movements: ____________ Start time: ____________ Finish time: ____________ Date: ____________ Movements: ____________ Start time: ____________ Finish time: ____________ Date: ____________ Movements: ____________ Start time: ____________ Finish time: ____________ Date: ____________ Movements: ____________ Start time: ____________ Finish time: ____________ Date: ____________  Movements: ____________ Start time: ____________ Finish time: ____________ Date: ____________ Movements: ____________ Start time: ____________ Finish time: ____________ Date: ____________ Movements: ____________ Start time: ____________ Finish time: ____________  Date: ____________ Movements: ____________ Start time: ____________ Finish time: ____________ Date: ____________ Movements: ____________ Start time: ____________ Finish time: ____________ Date: ____________ Movements: ____________ Start time: ____________ Finish time: ____________ Date: ____________ Movements: ____________ Start time: ____________ Finish time: ____________ Date: ____________ Movements: ____________ Start time: ____________ Finish time: ____________ Date: ____________ Movements: ____________ Start time: ____________ Finish time: ____________ Date: ____________ Movements: ____________ Start time: ____________ Finish time: ____________  Date: ____________ Movements: ____________ Start time: ____________ Finish time: ____________ Date: ____________ Movements: ____________ Start time: ____________ Finish time: ____________ Date: ____________ Movements: ____________ Start time: ____________ Finish time: ____________ Date: ____________ Movements: ____________ Start time: ____________ Finish time: ____________ Date: ____________ Movements: ____________ Start time: ____________ Finish time: ____________ Date: ____________ Movements: ____________ Start time: ____________ Finish time: ____________ Date: ____________ Movements: ____________ Start time: ____________ Finish time: ____________  Date: ____________ Movements: ____________ Start time: ____________ Finish time: ____________ Date: ____________ Movements: ____________ Start time: ____________ Finish time: ____________ Date: ____________ Movements: ____________ Start time: ____________ Finish time: ____________ Date: ____________ Movements: ____________ Start time:  ____________ Finish time: ____________ Date: ____________ Movements: ____________ Start time: ____________ Finish time: ____________ Date: ____________ Movements: ____________ Start time: ____________ Finish time: ____________ Date: ____________ Movements: ____________ Start time: ____________ Finish time: ____________  Date: ____________ Movements: ____________ Start time: ____________ Finish time: ____________ Date: ____________ Movements: ____________ Start   time: ____________ Finish time: ____________ Date: ____________ Movements: ____________ Start time: ____________ Finish time: ____________ Date: ____________ Movements: ____________ Start time: ____________ Finish time: ____________ Date: ____________ Movements: ____________ Start time: ____________ Finish time: ____________ Date: ____________ Movements: ____________ Start time: ____________ Finish time: ____________ Date: ____________ Movements: ____________ Start time: ____________ Finish time: ____________  Date: ____________ Movements: ____________ Start time: ____________ Finish time: ____________ Date: ____________ Movements: ____________ Start time: ____________ Finish time: ____________ Date: ____________ Movements: ____________ Start time: ____________ Finish time: ____________ Date: ____________ Movements: ____________ Start time: ____________ Finish time: ____________ Date: ____________ Movements: ____________ Start time: ____________ Finish time: ____________ Date: ____________ Movements: ____________ Start time: ____________ Finish time: ____________ Date: ____________ Movements: ____________ Start time: ____________ Finish time: ____________  Date: ____________ Movements: ____________ Start time: ____________ Finish time: ____________ Date: ____________ Movements: ____________ Start time: ____________ Finish time: ____________ Date: ____________ Movements: ____________ Start time: ____________ Finish time: ____________ Date:  ____________ Movements: ____________ Start time: ____________ Finish time: ____________ Date: ____________ Movements: ____________ Start time: ____________ Finish time: ____________ Date: ____________ Movements: ____________ Start time: ____________ Finish time: ____________ Document Released: 05/29/2006 Document Revised: 04/15/2012 Document Reviewed: 02/24/2012 [employer] Patient Information 2014 [employer].  

## 2013-10-14 NOTE — MAU Note (Signed)
Patient states she is having frequent contractions. Denies bleeding or leaking. Reports less than normal fetal movement.

## 2013-10-20 ENCOUNTER — Inpatient Hospital Stay (HOSPITAL_COMMUNITY)
Admission: AD | Admit: 2013-10-20 | Discharge: 2013-10-20 | Disposition: A | Discharge status: Home / Self Care | Payer: Medicaid Other | Source: Ambulatory Visit | Attending: Obstetrics and Gynecology | Admitting: Obstetrics and Gynecology

## 2013-10-20 ENCOUNTER — Encounter (HOSPITAL_COMMUNITY): Payer: Self-pay | Admitting: *Deleted

## 2013-10-20 DIAGNOSIS — O479 False labor, unspecified: Secondary | ICD-10-CM | POA: Insufficient documentation

## 2013-10-20 NOTE — Discharge Instructions (Signed)
Keep your scheduled appointment for prenatal care. Call the office or MD on call with further concerns or return to MAU. Drink 8-10 glasses of water per day.

## 2013-10-20 NOTE — MAU Note (Signed)
States she has noted leaking of fluid since yesterday. States baby did not move all day until last night. Contractions since this AM. States she was 3cm and not thinning on last exam.

## 2013-10-20 NOTE — MAU Provider Note (Signed)
First Provider Initiated Contact with Patient 10/20/13 [phone]     Ms. Nancy Palmer is a 28 y.o. [phone] at [redacted]w[redacted]d who presents to MAU today with complaint of contractions and ?LOF. She states that she noted a small amount of LOF since last night. She states contractions come frequently x 1 hour and then she will not have any x 2-3 hours. Pain is 4/10 with contractions. She denies vaginal bleeding. She reports good fetal movement.   BP 119/85  Pulse 95  Temp(Src) 98.3 F (36.8 C) (Oral)  Resp 18  Ht 5' 1.5" (1.562 m)  Wt 193 lb (87.544 kg)  BMI 35.88 kg/m2  LMP 01/30/2013 GENERAL: Well-developed, well-nourished female in no acute distress.  HEENT: Normocephalic, atraumatic.  LUNGS: Normal effort HEART: Regular rate  ABDOMEN: Soft, nontender, nondistended.  PELVIC: Normal external female genitalia. Vagina is pink and rugated.  Large amount of mucus discharge. Negative pooling.  Normal cervix contour. Uterus is gravid and appropriate for GA.   EXTREMITIES: No cyanosis, clubbing, or edema Dilation: 3 Effacement (%): Thick Exam by:: Naaman Plummer, PA   Ferning - negative  Fetal Monitoring: Baseline: 130 bpm, moderate variability, + accelerations, no decelerations Contractions: occasional, irregular  MDM Discussed with Dr. Tenny Craw. Cervix unchanged from last office visit. No regular contractions in MAU. Ok for discharge with labor precautions.   A: Membranes intact  P: Discharge home Labor precautions Patient to follow-up with Dr. Tenny Craw as scheduled for routine prenatal care Patient may return to MAU as needed or when in labor  Freddi Starr, PA-C 10/20/2013 9:01 AM

## 2013-10-28 ENCOUNTER — Encounter (HOSPITAL_COMMUNITY): Payer: Self-pay | Admitting: General Practice

## 2013-10-28 ENCOUNTER — Inpatient Hospital Stay (HOSPITAL_COMMUNITY)
Admission: AD | Admit: 2013-10-28 | Discharge: 2013-10-28 | Disposition: A | Discharge status: Home / Self Care | Payer: Medicaid Other | Source: Ambulatory Visit | Attending: Obstetrics and Gynecology | Admitting: Obstetrics and Gynecology

## 2013-10-28 DIAGNOSIS — O479 False labor, unspecified: Secondary | ICD-10-CM | POA: Insufficient documentation

## 2013-10-28 NOTE — MAU Note (Signed)
Pt reports she is having ctx since 6;30 about 5-7 min. Dilated to 3cm at last office visit.

## 2013-10-28 NOTE — Discharge Instructions (Signed)
Braxton Hicks Contractions °Contractions of the uterus can occur throughout pregnancy. Contractions are not always a sign that you are in labor.  °WHAT ARE BRAXTON HICKS CONTRACTIONS?  °Contractions that occur before labor are called Braxton Hicks contractions, or false labor. Toward the end of pregnancy (32-34 weeks), these contractions can develop more often and may become more forceful. This is not true labor because these contractions do not result in opening (dilatation) and thinning of the cervix. They are sometimes difficult to tell apart from true labor because these contractions can be forceful and people have different pain tolerances. You should not feel embarrassed if you go to the hospital with false labor. Sometimes, the only way to tell if you are in true labor is for your health care provider to look for changes in the cervix. °If there are no prenatal problems or other health problems associated with the pregnancy, it is completely safe to be sent home with false labor and await the onset of true labor. °HOW CAN YOU TELL THE DIFFERENCE BETWEEN TRUE AND FALSE LABOR? °False Labor °· The contractions of false labor are usually shorter and not as hard as those of true labor.   °· The contractions are usually irregular.   °· The contractions are often felt in the front of the lower abdomen and in the groin.   °· The contractions may go away when you walk around or change positions while lying down.   °· The contractions get weaker and are shorter lasting as time goes on.   °· The contractions do not usually become progressively stronger, regular, and closer together as with true labor.   °True Labor °· Contractions in true labor last 30-70 seconds, become very regular, usually become more intense, and increase in frequency.   °· The contractions do not go away with walking.   °· The discomfort is usually felt in the top of the uterus and spreads to the lower abdomen and low back.   °· True labor can be  determined by your health care provider with an exam. This will show that the cervix is dilating and getting thinner.   °WHAT TO REMEMBER °· Keep up with your usual exercises and follow other instructions given by your health care provider.   °· Take medicines as directed by your health care provider.   °· Keep your regular prenatal appointments.   °· Eat and drink lightly if you think you are going into labor.   °· If Braxton Hicks contractions are making you uncomfortable:   °¨ Change your position from lying down or resting to walking, or from walking to resting.   °¨ Sit and rest in a tub of warm water.   °¨ Drink 2-3 glasses of water. Dehydration may cause these contractions.   °¨ Do slow and deep breathing several times an hour.   °WHEN SHOULD I SEEK IMMEDIATE MEDICAL CARE? °Seek immediate medical care if: °· Your contractions become stronger, more regular, and closer together.   °· You have fluid leaking or gushing from your vagina.   °· You have a fever.   °· You pass blood-tinged mucus.   °· You have vaginal bleeding.   °· You have continuous abdominal pain.   °· You have low back pain that you never had before.   °· You feel your baby's head pushing down and causing pelvic pressure.   °· Your baby is not moving as much as it used to.   °Document Released: 04/29/2005 Document Revised: 05/04/2013 Document Reviewed: 02/08/2013 °[employer]® Patient Information ©2015 [employer]. This information is not intended to replace advice given to you by your health care   provider. Make sure you discuss any questions you have with your health care provider. ° °

## 2013-10-28 NOTE — MAU Note (Signed)
Pt presents to MAU with c/o uterine contractions every 7 minutes for the last hour.

## 2013-10-31 ENCOUNTER — Encounter (HOSPITAL_COMMUNITY): Payer: Self-pay | Admitting: *Deleted

## 2013-10-31 ENCOUNTER — Inpatient Hospital Stay (HOSPITAL_COMMUNITY)
Admission: AD | Admit: 2013-10-31 | Discharge: 2013-10-31 | Disposition: A | Discharge status: Home / Self Care | Payer: Medicaid Other | Source: Ambulatory Visit | Attending: Obstetrics and Gynecology | Admitting: Obstetrics and Gynecology

## 2013-10-31 DIAGNOSIS — O479 False labor, unspecified: Secondary | ICD-10-CM | POA: Insufficient documentation

## 2013-10-31 NOTE — Discharge Instructions (Signed)

## 2013-10-31 NOTE — MAU Note (Signed)
Assumed care of patient.

## 2013-10-31 NOTE — MAU Note (Signed)
Patient presents with complaint of contractions X 1 hour.

## 2013-11-01 ENCOUNTER — Encounter (HOSPITAL_COMMUNITY): Payer: Self-pay | Admitting: *Deleted

## 2013-11-01 ENCOUNTER — Inpatient Hospital Stay (HOSPITAL_COMMUNITY): Payer: Medicaid Other | Admitting: Anesthesiology

## 2013-11-01 ENCOUNTER — Inpatient Hospital Stay (HOSPITAL_COMMUNITY)
Admission: AD | Admit: 2013-11-01 | Discharge: 2013-11-03 | DRG: 774 | Disposition: A | Discharge status: Home / Self Care | Payer: Medicaid Other | Source: Ambulatory Visit | Attending: Obstetrics & Gynecology | Admitting: Obstetrics & Gynecology

## 2013-11-01 ENCOUNTER — Encounter (HOSPITAL_COMMUNITY): Payer: Medicaid Other | Admitting: Anesthesiology

## 2013-11-01 DIAGNOSIS — Z6837 Body mass index (BMI) 37.0-37.9, adult: Secondary | ICD-10-CM

## 2013-11-01 DIAGNOSIS — Z833 Family history of diabetes mellitus: Secondary | ICD-10-CM

## 2013-11-01 DIAGNOSIS — O1002 Pre-existing essential hypertension complicating childbirth: Secondary | ICD-10-CM

## 2013-11-01 DIAGNOSIS — E669 Obesity, unspecified: Secondary | ICD-10-CM

## 2013-11-01 DIAGNOSIS — O99214 Obesity complicating childbirth: Secondary | ICD-10-CM

## 2013-11-01 DIAGNOSIS — Z87891 Personal history of nicotine dependence: Secondary | ICD-10-CM

## 2013-11-01 DIAGNOSIS — O368390 Maternal care for abnormalities of the fetal heart rate or rhythm, unspecified trimester, not applicable or unspecified: Secondary | ICD-10-CM | POA: Diagnosis present

## 2013-11-01 DIAGNOSIS — IMO0001 Reserved for inherently not codable concepts without codable children: Secondary | ICD-10-CM

## 2013-11-01 LAB — TYPE AND SCREEN
ABO/RH(D): B POS
Antibody Screen: NEGATIVE

## 2013-11-01 LAB — CBC
HCT: 34.5 % — ABNORMAL LOW (ref 36.0–46.0)
HEMATOCRIT: 32.8 % — AB (ref 36.0–46.0)
HEMOGLOBIN: 11.1 g/dL — AB (ref 12.0–15.0)
Hemoglobin: 10.3 g/dL — ABNORMAL LOW (ref 12.0–15.0)
MCH: 29.1 pg (ref 26.0–34.0)
MCH: 28.5 pg (ref 26.0–34.0)
MCHC: 32.2 g/dL (ref 30.0–36.0)
MCHC: 31.4 g/dL (ref 30.0–36.0)
MCV: 90.3 fL (ref 78.0–100.0)
MCV: 90.9 fL (ref 78.0–100.0)
PLATELETS: 123 10*3/uL — AB (ref 150–400)
Platelets: 120 10*3/uL — ABNORMAL LOW (ref 150–400)
RBC: 3.82 MIL/uL — AB (ref 3.87–5.11)
RBC: 3.61 MIL/uL — ABNORMAL LOW (ref 3.87–5.11)
RDW: 15.1 % (ref 11.5–15.5)
RDW: 15 % (ref 11.5–15.5)
WBC: 10.9 10*3/uL — AB (ref 4.0–10.5)
WBC: 10.5 10*3/uL (ref 4.0–10.5)

## 2013-11-01 MED ORDER — PHENYLEPHRINE 40 MCG/ML (10ML) SYRINGE FOR IV PUSH (FOR BLOOD PRESSURE SUPPORT)
80.0000 ug | PREFILLED_SYRINGE | INTRAVENOUS | Status: DC | PRN
  Filled 2013-11-01: qty 2

## 2013-11-01 MED ORDER — ONDANSETRON HCL 4 MG/2ML IJ SOLN: 4.0000 mg | Freq: Four times a day (QID) | INTRAMUSCULAR | Status: DC | PRN

## 2013-11-01 MED ORDER — IBUPROFEN 600 MG PO TABS
600.0000 mg | ORAL_TABLET | Freq: Four times a day (QID) | ORAL | Status: DC
  Administered 2013-11-01 – 2013-11-03 (×6): 600 mg via ORAL
  Filled 2013-11-01 (×7): qty 1

## 2013-11-01 MED ORDER — EPHEDRINE 5 MG/ML INJ
10.0000 mg | INTRAVENOUS | Status: DC | PRN
  Filled 2013-11-01: qty 2

## 2013-11-01 MED ORDER — PHENYLEPHRINE 40 MCG/ML (10ML) SYRINGE FOR IV PUSH (FOR BLOOD PRESSURE SUPPORT)
PREFILLED_SYRINGE | INTRAVENOUS | Status: AC
  Filled 2013-11-01: qty 10

## 2013-11-01 MED ORDER — ONDANSETRON HCL 4 MG/2ML IJ SOLN: 4.0000 mg | INTRAMUSCULAR | Status: DC | PRN

## 2013-11-01 MED ORDER — OXYTOCIN BOLUS FROM INFUSION: 500.0000 mL | INTRAVENOUS | Status: DC

## 2013-11-01 MED ORDER — OXYCODONE-ACETAMINOPHEN 5-325 MG PO TABS
1.0000 | ORAL_TABLET | ORAL | Status: DC | PRN
  Administered 2013-11-01: 2 via ORAL
  Administered 2013-11-01 – 2013-11-03 (×3): 1 via ORAL
  Filled 2013-11-01: qty 1
  Filled 2013-11-01: qty 2
  Filled 2013-11-01 (×2): qty 1

## 2013-11-01 MED ORDER — DIBUCAINE 1 % RE OINT: 1.0000 "application " | TOPICAL_OINTMENT | RECTAL | Status: DC | PRN

## 2013-11-01 MED ORDER — LANOLIN HYDROUS EX OINT: TOPICAL_OINTMENT | CUTANEOUS | Status: DC | PRN

## 2013-11-01 MED ORDER — BENZOCAINE-MENTHOL 20-0.5 % EX AERO: 1.0000 "application " | INHALATION_SPRAY | CUTANEOUS | Status: DC | PRN

## 2013-11-01 MED ORDER — FENTANYL 2.5 MCG/ML BUPIVACAINE 1/10 % EPIDURAL INFUSION (WH - ANES)
14.0000 mL/h | INTRAMUSCULAR | Status: DC | PRN
  Administered 2013-11-01: 14 mL/h via EPIDURAL

## 2013-11-01 MED ORDER — TETANUS-DIPHTH-ACELL PERTUSSIS 5-2.5-18.5 LF-MCG/0.5 IM SUSP
0.5000 mL | Freq: Once | INTRAMUSCULAR | Status: AC
  Administered 2013-11-03: 0.5 mL via INTRAMUSCULAR
  Filled 2013-11-01: qty 0.5

## 2013-11-01 MED ORDER — ZOLPIDEM TARTRATE 5 MG PO TABS
5.0000 mg | ORAL_TABLET | Freq: Every evening | ORAL | Status: DC | PRN
Start: 2013-11-01 — End: 2013-11-03

## 2013-11-01 MED ORDER — FENTANYL 2.5 MCG/ML BUPIVACAINE 1/10 % EPIDURAL INFUSION (WH - ANES)
INTRAMUSCULAR | Status: AC
  Filled 2013-11-01: qty 125

## 2013-11-01 MED ORDER — WITCH HAZEL-GLYCERIN EX PADS: 1.0000 "application " | MEDICATED_PAD | CUTANEOUS | Status: DC | PRN

## 2013-11-01 MED ORDER — IBUPROFEN 600 MG PO TABS
600.0000 mg | ORAL_TABLET | Freq: Four times a day (QID) | ORAL | Status: DC | PRN
  Administered 2013-11-01: 600 mg via ORAL
  Filled 2013-11-01: qty 1

## 2013-11-01 MED ORDER — LACTATED RINGERS IV SOLN: INTRAVENOUS | Status: DC

## 2013-11-01 MED ORDER — LACTATED RINGERS IV SOLN: 500.0000 mL | INTRAVENOUS | Status: DC | PRN

## 2013-11-01 MED ORDER — OXYCODONE-ACETAMINOPHEN 5-325 MG PO TABS
1.0000 | ORAL_TABLET | ORAL | Status: DC | PRN
  Administered 2013-11-01: 1 via ORAL
  Filled 2013-11-01: qty 1

## 2013-11-01 MED ORDER — EPHEDRINE 5 MG/ML INJ
INTRAVENOUS | Status: AC
  Filled 2013-11-01: qty 4

## 2013-11-01 MED ORDER — SENNOSIDES-DOCUSATE SODIUM 8.6-50 MG PO TABS
2.0000 | ORAL_TABLET | ORAL | Status: DC
  Administered 2013-11-02: 2 via ORAL
  Filled 2013-11-01 (×2): qty 2

## 2013-11-01 MED ORDER — DIPHENHYDRAMINE HCL 25 MG PO CAPS
25.0000 mg | ORAL_CAPSULE | Freq: Four times a day (QID) | ORAL | Status: DC | PRN
  Administered 2013-11-01: 25 mg via ORAL
  Filled 2013-11-01: qty 1

## 2013-11-01 MED ORDER — PRENATAL MULTIVITAMIN CH
1.0000 | ORAL_TABLET | Freq: Every day | ORAL | Status: DC
  Administered 2013-11-02 – 2013-11-03 (×2): 1 via ORAL
  Filled 2013-11-01 (×2): qty 1

## 2013-11-01 MED ORDER — OXYTOCIN 40 UNITS IN LACTATED RINGERS INFUSION - SIMPLE MED
62.5000 mL/h | INTRAVENOUS | Status: DC
  Administered 2013-11-01: 62.5 mL/h via INTRAVENOUS
  Filled 2013-11-01: qty 1000

## 2013-11-01 MED ORDER — CITRIC ACID-SODIUM CITRATE 334-500 MG/5ML PO SOLN: 30.0000 mL | ORAL | Status: DC | PRN

## 2013-11-01 MED ORDER — LIDOCAINE HCL (PF) 1 % IJ SOLN
30.0000 mL | INTRAMUSCULAR | Status: DC | PRN
  Filled 2013-11-01: qty 30

## 2013-11-01 MED ORDER — LIDOCAINE HCL (PF) 1 % IJ SOLN
INTRAMUSCULAR | Status: DC | PRN
  Administered 2013-11-01 (×2): 5 mL

## 2013-11-01 MED ORDER — LACTATED RINGERS IV SOLN
500.0000 mL | Freq: Once | INTRAVENOUS | Status: AC
  Administered 2013-11-01: 500 mL via INTRAVENOUS

## 2013-11-01 MED ORDER — ACETAMINOPHEN 325 MG PO TABS: 650.0000 mg | ORAL_TABLET | ORAL | Status: DC | PRN

## 2013-11-01 MED ORDER — ONDANSETRON HCL 4 MG PO TABS
4.0000 mg | ORAL_TABLET | ORAL | Status: DC | PRN
Start: 2013-11-01 — End: 2013-11-03

## 2013-11-01 MED ORDER — SIMETHICONE 80 MG PO CHEW: 80.0000 mg | CHEWABLE_TABLET | ORAL | Status: DC | PRN

## 2013-11-01 MED ORDER — DIPHENHYDRAMINE HCL 50 MG/ML IJ SOLN: 12.5000 mg | INTRAMUSCULAR | Status: DC | PRN

## 2013-11-01 NOTE — MAU Note (Signed)
C/o ucs for past 2 weeks but have gotten stronger and closer in past 2 hours;

## 2013-11-01 NOTE — H&P (Signed)
Nancy PaceLisa M Palmer is a 28 y.o. female presenting for regular contractions.  She was admitted from MAU to L&D for variable decelerations.   Maternal Medical History:  Reason for admission: Contractions.   Contractions: Onset was 6-12 hours ago.   Frequency: regular.   Duration is approximately 60 seconds.   Perceived severity is strong.    Fetal activity: Perceived fetal activity is normal.   Last perceived fetal movement was within the past 12 hours.    Prenatal complications: no prenatal complications Prenatal Complications - Diabetes: none.    OB History   Grav Para Term Preterm Abortions TAB SAB Ect Mult Living   4 3 2 1      3      Past Medical History  Diagnosis Date  . Smoker   . No pertinent past medical history   . H/O varicella   . HPV (human papilloma virus) anogenital infection   . Abnormal Pap smear   . Panic attack   . Vaginal Pap smear, abnormal    Past Surgical History  Procedure Laterality Date  . Mandible surgery    . Cryotherapy  2008 cervix   Family History: family history includes COPD in her maternal grandmother and paternal grandfather; Diabetes in her paternal grandmother; Heart disease in her father. There is no history of Other. Social History:  reports that she quit smoking about 2 years ago. She has never used smokeless tobacco. She reports that she does not drink alcohol or use illicit drugs.   Prenatal Transfer Tool  Maternal Diabetes: No Genetic Screening: Normal Maternal Ultrasounds/Referrals: Normal Fetal Ultrasounds or other Referrals:  None Maternal Substance Abuse:  No Significant Maternal Medications:  Meds include: Other: PNV Significant Maternal Lab Results:  Lab values include: Group B Strep negative Other Comments:  None  Review of Systems  Constitutional: Negative for fever and chills.  Eyes: Negative for blurred vision.  Respiratory: Negative for cough.   Cardiovascular: Negative for chest pain.  Gastrointestinal: Negative  for heartburn.  Genitourinary: Negative for dysuria.  Musculoskeletal: Negative for myalgias.  Skin: Negative for rash.  Neurological: Negative for dizziness and headaches.  Endo/Heme/Allergies: Does not bruise/bleed easily.    Dilation: 6.5 Effacement (%): 90 Station: -1;0 Exam by:: [name] ApplPinn Blood pressure 128/70, pulse 93, temperature 98 F (36.7 C), temperature source Oral, resp. rate 20, last menstrual period 01/30/2013, SpO2 99.00%. Maternal Exam:  Uterine Assessment: Contraction strength is moderate.  Contraction duration is 60 seconds. Contraction frequency is regular.   Abdomen: Patient reports no abdominal tenderness. Fundal height is 39 cm.   Estimated fetal weight is 3200 grams.   Fetal presentation: vertex  Introitus: Normal vulva. Normal vagina.  Pelvis: adequate for delivery.   Cervix: Cervix evaluated by digital exam.   1cm by MAU / 3cm in office  Fetal Exam Fetal Monitor Review: Mode: hand-held doppler probe.   Baseline rate: 140.  Variability: moderate (6-25 bpm).   Pattern: variable decelerations.    Fetal State Assessment: Category II - tracings are indeterminate.     Physical Exam  Vitals reviewed. Constitutional: She is oriented to person, place, and time. She appears well-developed and well-nourished.  HENT:  Head: Normocephalic and atraumatic.  Cardiovascular: Normal rate.   Respiratory: Effort normal and breath sounds normal.  GI: Soft.  Genitourinary: Vagina normal and uterus normal.  Musculoskeletal: Normal range of motion.  Neurological: She is alert and oriented to person, place, and time. She has normal reflexes.    Prenatal labs: ABO, Rh:  Antibody:   Rubella:   RPR: Nonreactive (03/02 0000)  HBsAg:    HIV: Non-reactive (03/02 0000)  GBS: Negative (06/01 0000)   Assessment/PLAN: [id number] at term with painful contractions in early labor and Variable decels Admit to L&D for continuous monitoring Induction of labor if no  change Epidural on demand   Nancy Palmer STACIA 11/01/2013, 12:28 PM

## 2013-11-01 NOTE — Progress Notes (Signed)
Nancy PaceLisa M Mancinelli is a 28 y.o. [phone] at 3865w2d by LMP admitted for early labor with variable decels  Subjective: Patient comfortable now with epidural  Objective: BP 132/87  Pulse 92  Temp(Src) 98 F (36.7 C) (Oral)  Resp 20  SpO2 100%  LMP 01/30/2013      FHT:  FHR: 135 bpm, variability: moderate,  accelerations:  Present,  decelerations:  Present variables UC:   regular, every 2-5 minutes SVE:   Dilation: 6.5 Effacement (%): 90 Station: -1;0 Exam by:: [name] ApplPinn  Labs: Lab Results  Component Value Date   WBC 10.9* 11/01/2013   HGB 11.1* 11/01/2013   HCT 34.5* 11/01/2013   MCV 90.3 11/01/2013   PLT 123* 11/01/2013    Assessment / Plan: Spontaneous labor, progressing normally  Labor: Progressing normally Preeclampsia:  no signs or symptoms of toxicity Fetal Wellbeing:  Category II Pain Control:  Epidural I/D:  n/a Anticipated MOD:  NSVD  PINN, WALDA STACIA 11/01/2013, 12:32 PM

## 2013-11-01 NOTE — Anesthesia Preprocedure Evaluation (Addendum)
Anesthesia Evaluation  Patient identified by MRN, date of birth, ID band Patient awake    Reviewed: Allergy & Precautions, H&P , Patient's Chart, lab work & pertinent test results  Airway Mallampati: III TM Distance: <3 FB Neck ROM: full    Dental   Pulmonary former smoker,  breath sounds clear to auscultation        Cardiovascular hypertension, Rhythm:regular Rate:Normal     Neuro/Psych    GI/Hepatic   Endo/Other  Morbid obesity  Renal/GU      Musculoskeletal   Abdominal   Peds  Hematology   Anesthesia Other Findings   Reproductive/Obstetrics (+) Pregnancy                          Anesthesia Physical Anesthesia Plan  ASA: III  Anesthesia Plan: Epidural   Post-op Pain Management:    Induction:   Airway Management Planned:   Additional Equipment:   Intra-op Plan:   Post-operative Plan:   Informed Consent: I have reviewed the patients History and Physical, chart, labs and discussed the procedure including the risks, benefits and alternatives for the proposed anesthesia with the patient or authorized representative who has indicated his/her understanding and acceptance.     Plan Discussed with:   Anesthesia Plan Comments:         Anesthesia Quick Evaluation

## 2013-11-01 NOTE — Anesthesia Procedure Notes (Signed)
Epidural Patient location during procedure: OB Start time: 11/01/2013 12:15 PM  Staffing Anesthesiologist: Brayton CavesJACKSON, FREEMAN Performed by: anesthesiologist   Preanesthetic Checklist Completed: patient identified, site marked, surgical consent, pre-op evaluation, timeout performed, IV checked, risks and benefits discussed and monitors and equipment checked  Epidural Patient position: sitting Prep: site prepped and draped and DuraPrep Patient monitoring: continuous pulse ox and blood pressure Approach: midline Location: L3-L4 Injection technique: LOR air  Needle:  Needle type: Tuohy  Needle gauge: 17 G Needle length: 9 cm and 9 Needle insertion depth: 7 cm Catheter type: closed end flexible Catheter size: 19 Gauge Catheter at skin depth: 12 cm Test dose: negative  Assessment Events: blood not aspirated, injection not painful, no injection resistance, negative IV test and no paresthesia  Additional Notes Patient identified.  Risk benefits discussed including failed block, incomplete pain control, headache, nerve damage, paralysis, blood pressure changes, nausea, vomiting, reactions to medication both toxic or allergic, and postpartum back pain.  Patient expressed understanding and wished to proceed.  All questions were answered.  Sterile technique used throughout procedure and epidural site dressed with sterile barrier dressing. No paresthesia or other complications noted.The patient did not experience any signs of intravascular injection such as tinnitus or metallic taste in mouth nor signs of intrathecal spread such as rapid motor block. Please see nursing notes for vital signs.

## 2013-11-02 LAB — CBC
HEMATOCRIT: 29.3 % — AB (ref 36.0–46.0)
Hemoglobin: 9.2 g/dL — ABNORMAL LOW (ref 12.0–15.0)
MCH: 28.8 pg (ref 26.0–34.0)
MCHC: 31.4 g/dL (ref 30.0–36.0)
MCV: 91.6 fL (ref 78.0–100.0)
Platelets: 104 10*3/uL — ABNORMAL LOW (ref 150–400)
RBC: 3.2 MIL/uL — ABNORMAL LOW (ref 3.87–5.11)
RDW: 15.3 % (ref 11.5–15.5)
WBC: 8.5 10*3/uL (ref 4.0–10.5)

## 2013-11-02 LAB — RPR

## 2013-11-02 LAB — RAPID HIV SCREEN (WH-MAU): Rapid HIV Screen: NONREACTIVE

## 2013-11-02 NOTE — Anesthesia Postprocedure Evaluation (Signed)
Anesthesia Post Note  Patient: Nancy PaceLisa M Vickrey  Procedure(s) Performed: * No procedures listed *  Anesthesia type: Epidural  Patient location: Mother/Baby  Post pain: Pain level controlled  Post assessment: Post-op Vital signs reviewed  Last Vitals:  Filed Vitals:   11/02/13 0410  BP: 98/52  Pulse: 75  Temp: 36.4 C  Resp: 18    Post vital signs: Reviewed  Level of consciousness:alert  Complications: No apparent anesthesia complications

## 2013-11-02 NOTE — Discharge Summary (Signed)
Obstetric Discharge Summary Reason for Admission: onset of labor Prenatal Procedures: none Intrapartum Procedures: spontaneous vaginal delivery Postpartum Procedures: none Complications-Operative and Postpartum: none Hemoglobin  Date Value Ref Range Status  11/02/2013 9.2* 12.0 - 15.0 g/dL Final     HCT  Date Value Ref Range Status  11/02/2013 29.3* 36.0 - 46.0 % Final    Discharge Diagnoses: Term Pregnancy-delivered  Discharge Information: Date: 11/02/2013 Activity: pelvic rest Diet: routine Medications: Ibuprofen, iron Condition: stable Instructions: refer to practice specific booklet Discharge to: home Follow-up Information   Follow up with PINN, Sanjuana MaeWALDA STACIA, MD In 4 weeks.   Specialty:  Obstetrics and Gynecology   Contact information:   [address] [place] [address] [phone]       Newborn Data: Live born female  Birth Weight: 6 lb 15.5 oz (3160 g) APGAR: 9, 9  Home with mother.  HORVATH,MICHELLE A 11/02/2013, 8:06 AM

## 2013-11-02 NOTE — Progress Notes (Signed)
Unable to do circ- not paid hospital yet.

## 2013-11-02 NOTE — Progress Notes (Signed)
Patient is eating, ambulating, voiding.  Pain control is good.  Filed Vitals:   11/01/13 1431 11/01/13 1455 11/01/13 2000 11/02/13 0410  BP: 137/85 123/83 103/69 98/52  Pulse: 81 74 73 75  Temp:  98.1 F (36.7 C) 98.5 F (36.9 C) 97.5 F (36.4 C)  TempSrc:  Oral Oral Oral  Resp: 18 18 18 18   Height:      Weight:      SpO2:  97%      Fundus firm Perineum without swelling.  Lab Results  Component Value Date   WBC 8.5 11/02/2013   HGB 9.2* 11/02/2013   HCT 29.3* 11/02/2013   MCV 91.6 11/02/2013   PLT 104* 11/02/2013    --/--/B POS (06/22 1120)/RI  A/P Post partum day 1.  Routine care.  Expect d/c routine.    HORVATH,MICHELLE A

## 2013-11-03 MED ORDER — OXYCODONE-ACETAMINOPHEN 5-325 MG PO TABS: 2.0000 | ORAL_TABLET | ORAL | Status: DC | PRN

## 2013-11-03 MED ORDER — DOCUSATE SODIUM 100 MG PO CAPS: 100.0000 mg | ORAL_CAPSULE | Freq: Two times a day (BID) | ORAL | Status: DC

## 2013-11-03 MED ORDER — IBUPROFEN 600 MG PO TABS: 600.0000 mg | ORAL_TABLET | Freq: Four times a day (QID) | ORAL | Status: DC | PRN

## 2013-11-03 NOTE — Discharge Summary (Signed)
Obstetric Discharge Summary Reason for Admission: onset of labor Prenatal Procedures: NST and ultrasound Intrapartum Procedures: spontaneous vaginal delivery Postpartum Procedures: none Complications-Operative and Postpartum: none Hemoglobin  Date Value Ref Range Status  11/02/2013 9.2* 12.0 - 15.0 g/dL Final     HCT  Date Value Ref Range Status  11/02/2013 29.3* 36.0 - 46.0 % Final    Physical Exam:  General: alert, cooperative and appears stated age 63Lochia: appropriate Uterine Fundus: firm  Discharge Diagnoses: Term Pregnancy-delivered  Discharge Information: Date: 11/03/2013 Activity: pelvic rest Diet: routine Medications: Ibuprofen, Colace and Percocet Condition: improved Instructions: refer to practice specific booklet Discharge to: home Follow-up Information   Follow up with PINN, Sanjuana MaeWALDA STACIA, MD In 4 weeks.   Specialty:  Obstetrics and Gynecology   Contact information:   [address] [place] [address] [phone]       Follow up with Wynonia HazardPINN, WALDA STACIA, MD In 4 weeks. (For a postpartum evaluation)    Specialty:  Obstetrics and Gynecology   Contact information:   [address] [place] [address] [phone]       Newborn Data: Live born female  Birth Weight: 6 lb 15.5 oz (3160 g) APGAR: 9, 9  Home with mother.  ROSS,KENDRA H. 11/03/2013, 9:12 AM

## 2013-11-03 NOTE — Progress Notes (Signed)
Ur chart review completed.  

## 2013-11-03 NOTE — Discharge Instructions (Signed)
Vaginal Delivery °Care After °Refer to this sheet in the next few weeks. These discharge instructions provide you with information on caring for yourself after delivery. Your caregiver may also give you specific instructions. Your treatment has been planned according to the most current medical practices available, but problems sometimes occur. Call your caregiver if you have any problems or questions after you go home. °HOME CARE INSTRUCTIONS °· Take over-the-counter or prescription medicines only as directed by your caregiver or pharmacist. °· Do not drink alcohol, especially if you are breastfeeding or taking medicine to relieve pain. °· Do not chew or smoke tobacco. °· Do not use illegal drugs. °· Continue to use good perineal care. Good perineal care includes: °¨ Wiping your perineum from front to back. °¨ Keeping your perineum clean. °· Do not use tampons or douche until your caregiver says it is okay. °· Shower, wash your hair, and take tub baths as directed by your caregiver. °· Wear a well-fitting bra that provides breast support. °· Eat healthy foods. °· Drink enough fluids to keep your urine clear or pale yellow. °· Eat high-fiber foods such as whole grain cereals and breads, brown rice, beans, and fresh fruits and vegetables every day. These foods may help prevent or relieve constipation. °· Follow your cargiver's recommendations regarding resumption of activities such as climbing stairs, driving, lifting, exercising, or traveling. °· Talk to your caregiver about resuming sexual activities. Resumption of sexual activities is dependent upon your risk of infection, your rate of healing, and your comfort and desire to resume sexual activity. °· Try to have someone help you with your household activities and your newborn for at least a few days after you leave the hospital. °· Rest as much as possible. Try to rest or take a nap when your newborn is sleeping. °· Increase your activities gradually. °· Keep all  of your scheduled postpartum appointments. It is very important to keep your scheduled follow-up appointments. At these appointments, your caregiver will be checking to make sure that you are healing physically and emotionally. °SEEK MEDICAL CARE IF:  °· You are passing large clots from your vagina. Save any clots to show your caregiver. °· You have a foul smelling discharge from your vagina. °· You have trouble urinating. °· You are urinating frequently. °· You have pain when you urinate. °· You have a change in your bowel movements. °· You have increasing redness, pain, or swelling near your vaginal incision (episiotomy) or vaginal tear. °· You have pus draining from your episiotomy or vaginal tear. °· Your episiotomy or vaginal tear is separating. °· You have painful, hard, or reddened breasts. °· You have a severe headache. °· You have blurred vision or see spots. °· You feel sad or depressed. °· You have thoughts of hurting yourself or your newborn. °· You have questions about your care, the care of your newborn, or medicines. °· You are dizzy or lightheaded. °· You have a rash. °· You have nausea or vomiting. °· You were breastfeeding and have not had a menstrual period within 12 weeks after you stopped breastfeeding. °· You are not breastfeeding and have not had a menstrual period by the 12th week after delivery. °· You have a fever. °SEEK IMMEDIATE MEDICAL CARE IF:  °· You have persistent pain. °· You have chest pain. °· You have shortness of breath. °· You faint. °· You have leg pain. °· You have stomach pain. °· Your vaginal bleeding saturates two or more sanitary pads   in 1 hour. °MAKE SURE YOU:  °· Understand these instructions. °· Will watch your condition. °· Will get help right away if you are not doing well or get worse. ° ° °Document Released: 04/26/2000 Document Revised: 01/22/2012 Document Reviewed: 12/25/2011 °ExitCare® Patient Information ©2015 [employer]. This information is not intended to  replace advice given to you by your health care provider. Make sure you discuss any questions you have with your health care provider. ° °

## 2014-03-03 ENCOUNTER — Encounter (HOSPITAL_BASED_OUTPATIENT_CLINIC_OR_DEPARTMENT_OTHER): Payer: Self-pay | Admitting: Emergency Medicine

## 2014-03-03 ENCOUNTER — Emergency Department (HOSPITAL_BASED_OUTPATIENT_CLINIC_OR_DEPARTMENT_OTHER)
Admission: EM | Admit: 2014-03-03 | Discharge: 2014-03-03 | Disposition: A | Discharge status: Home / Self Care | Payer: Medicaid Other | Attending: Emergency Medicine | Admitting: Emergency Medicine

## 2014-03-03 ENCOUNTER — Emergency Department (HOSPITAL_BASED_OUTPATIENT_CLINIC_OR_DEPARTMENT_OTHER): Payer: Medicaid Other

## 2014-03-03 DIAGNOSIS — K8020 Calculus of gallbladder without cholecystitis without obstruction: Secondary | ICD-10-CM | POA: Diagnosis not present

## 2014-03-03 DIAGNOSIS — Z79899 Other long term (current) drug therapy: Secondary | ICD-10-CM | POA: Diagnosis not present

## 2014-03-03 DIAGNOSIS — Z880 Allergy status to penicillin: Secondary | ICD-10-CM | POA: Diagnosis not present

## 2014-03-03 DIAGNOSIS — Z3202 Encounter for pregnancy test, result negative: Secondary | ICD-10-CM | POA: Insufficient documentation

## 2014-03-03 DIAGNOSIS — Z87891 Personal history of nicotine dependence: Secondary | ICD-10-CM | POA: Insufficient documentation

## 2014-03-03 DIAGNOSIS — R1010 Upper abdominal pain, unspecified: Secondary | ICD-10-CM

## 2014-03-03 DIAGNOSIS — Z8619 Personal history of other infectious and parasitic diseases: Secondary | ICD-10-CM | POA: Diagnosis not present

## 2014-03-03 LAB — COMPREHENSIVE METABOLIC PANEL
ALK PHOS: 107 U/L (ref 39–117)
ALT: 38 U/L — AB (ref 0–35)
ANION GAP: 13 (ref 5–15)
AST: 70 U/L — ABNORMAL HIGH (ref 0–37)
Albumin: 3.9 g/dL (ref 3.5–5.2)
BILIRUBIN TOTAL: 0.4 mg/dL (ref 0.3–1.2)
BUN: 10 mg/dL (ref 6–23)
CO2: 26 meq/L (ref 19–32)
Calcium: 9.4 mg/dL (ref 8.4–10.5)
Chloride: 102 mEq/L (ref 96–112)
Creatinine, Ser: 1 mg/dL (ref 0.50–1.10)
GFR calc Af Amer: 88 mL/min — ABNORMAL LOW (ref >90)
GFR, EST NON AFRICAN AMERICAN: 76 mL/min — AB (ref >90)
Glucose, Bld: 111 mg/dL — ABNORMAL HIGH (ref 70–99)
POTASSIUM: 3.5 meq/L — AB (ref 3.7–5.3)
SODIUM: 141 meq/L (ref 137–147)
Total Protein: 7.6 g/dL (ref 6.0–8.3)

## 2014-03-03 LAB — CBC WITH DIFFERENTIAL/PLATELET
Basophils Absolute: 0 10*3/uL (ref 0.0–0.1)
Basophils Relative: 0 % (ref 0–1)
Eosinophils Absolute: 0.1 10*3/uL (ref 0.0–0.7)
Eosinophils Relative: 1 % (ref 0–5)
HCT: 39 % (ref 36.0–46.0)
Hemoglobin: 12.8 g/dL (ref 12.0–15.0)
LYMPHS ABS: 2 10*3/uL (ref 0.7–4.0)
Lymphocytes Relative: 20 % (ref 12–46)
MCH: 29.2 pg (ref 26.0–34.0)
MCHC: 32.8 g/dL (ref 30.0–36.0)
MCV: 89 fL (ref 78.0–100.0)
Monocytes Absolute: 0.8 10*3/uL (ref 0.1–1.0)
Monocytes Relative: 8 % (ref 3–12)
NEUTROS PCT: 71 % (ref 43–77)
Neutro Abs: 7.1 10*3/uL (ref 1.7–7.7)
PLATELETS: 206 10*3/uL (ref 150–400)
RBC: 4.38 MIL/uL (ref 3.87–5.11)
RDW: 13.5 % (ref 11.5–15.5)
WBC: 10 10*3/uL (ref 4.0–10.5)

## 2014-03-03 LAB — URINALYSIS, ROUTINE W REFLEX MICROSCOPIC
BILIRUBIN URINE: NEGATIVE
Glucose, UA: NEGATIVE mg/dL
HGB URINE DIPSTICK: NEGATIVE
Ketones, ur: NEGATIVE mg/dL
NITRITE: NEGATIVE
PH: 6.5 (ref 5.0–8.0)
Protein, ur: NEGATIVE mg/dL
Specific Gravity, Urine: 1.015 (ref 1.005–1.030)
Urobilinogen, UA: 1 mg/dL (ref 0.0–1.0)

## 2014-03-03 LAB — URINE MICROSCOPIC-ADD ON

## 2014-03-03 LAB — LIPASE, BLOOD: Lipase: 31 U/L (ref 11–59)

## 2014-03-03 LAB — PREGNANCY, URINE: Preg Test, Ur: NEGATIVE

## 2014-03-03 MED ORDER — ONDANSETRON HCL 4 MG/2ML IJ SOLN
4.0000 mg | Freq: Once | INTRAMUSCULAR | Status: AC
  Administered 2014-03-03: 4 mg via INTRAVENOUS
  Filled 2014-03-03: qty 2

## 2014-03-03 MED ORDER — SODIUM CHLORIDE 0.9 % IV SOLN
INTRAVENOUS | Status: DC
  Administered 2014-03-03: 125 mL/h via INTRAVENOUS

## 2014-03-03 MED ORDER — PANTOPRAZOLE SODIUM 40 MG IV SOLR
40.0000 mg | Freq: Once | INTRAVENOUS | Status: AC
  Administered 2014-03-03: 40 mg via INTRAVENOUS
  Filled 2014-03-03: qty 40

## 2014-03-03 MED ORDER — FENTANYL CITRATE 0.05 MG/ML IJ SOLN
50.0000 ug | INTRAMUSCULAR | Status: DC | PRN
  Administered 2014-03-03 (×2): 50 ug via INTRAVENOUS
  Filled 2014-03-03: qty 2

## 2014-03-03 MED ORDER — FENTANYL CITRATE 0.05 MG/ML IJ SOLN
INTRAMUSCULAR | Status: AC
  Filled 2014-03-03: qty 2

## 2014-03-03 MED ORDER — HYDROCODONE-ACETAMINOPHEN 5-325 MG PO TABS: 1.0000 | ORAL_TABLET | Freq: Four times a day (QID) | ORAL | Status: DC | PRN

## 2014-03-03 MED ORDER — FENTANYL CITRATE 0.05 MG/ML IJ SOLN
100.0000 ug | Freq: Once | INTRAMUSCULAR | Status: AC
  Administered 2014-03-03: 100 ug via INTRAVENOUS
  Filled 2014-03-03: qty 2

## 2014-03-03 MED ORDER — MORPHINE SULFATE 4 MG/ML IJ SOLN
4.0000 mg | Freq: Once | INTRAMUSCULAR | Status: AC
  Administered 2014-03-03: 4 mg via INTRAVENOUS
  Filled 2014-03-03: qty 1

## 2014-03-03 NOTE — ED Provider Notes (Signed)
CSN: [id number]     Arrival date & time 03/03/14  0520 History   First MD Initiated Contact with Patient 03/03/14 [phone]     Chief Complaint  Patient presents with  . Abdominal Pain     (Consider location/radiation/quality/duration/timing/severity/associated sxs/prior Treatment) HPI This is a 28 year old female who had a baby earlier this year. She is here with abdominal pain that began yesterday evening about 10 PM. She describes the pain as being epigastric and radiating around her abdomen to the left and to the right as well as into her back. She characterizes the pain as feeling like an epidural. The pain initially subsided and she went to bed. It returned about 4 AM it has been constant since. It is worse with movement or palpation. She is nauseated but has not had any vomiting or diarrhea. She has not had fever.  Past Medical History  Diagnosis Date  . Smoker   . H/O varicella   . HPV (human papilloma virus) anogenital infection   . Panic attack   . Abnormal Pap smear    Past Surgical History  Procedure Laterality Date  . Mandible surgery    . Cryotherapy  2008 cervix   Family History  Problem Relation Age of Onset  . Other Neg Hx   . COPD Maternal Grandmother   . Diabetes Paternal Grandmother   . Heart disease Father   . COPD Paternal Grandfather    History  Substance Use Topics  . Smoking status: Former Smoker    Quit date: 12/11/2010  . Smokeless tobacco: Never Used  . Alcohol Use: No   OB History   Grav Para Term Preterm Abortions TAB SAB Ect Mult Living   4 4 3 1      4      Review of Systems  All other systems reviewed and are negative.   Allergies  Penicillins  Home Medications   Prior to Admission medications   Medication Sig Start Date End Date Taking? Authorizing Provider  acetaminophen (TYLENOL) 500 MG tablet Take 1,000 mg by mouth every 6 (six) hours as needed for moderate pain.    Historical Provider, MD  calcium carbonate (TUMS - DOSED IN MG  ELEMENTAL CALCIUM) 500 MG chewable tablet Chew 2 tablets by mouth daily as needed for indigestion or heartburn.    Historical Provider, MD  docusate sodium (COLACE) 100 MG capsule Take 1 capsule (100 mg total) by mouth 2 (two) times daily. 11/03/13   Kendra H. Tenny Crawoss, MD  HYDROcodone-acetaminophen (NORCO) 5-325 MG per tablet Take 1-2 tablets by mouth every 6 (six) hours as needed. 03/03/14   Geoffery Lyonsouglas Delo, MD  ibuprofen (ADVIL,MOTRIN) 600 MG tablet Take 1 tablet (600 mg total) by mouth every 6 (six) hours as needed. 11/03/13   Kendra H. Tenny Crawoss, MD  oxyCODONE-acetaminophen (ROXICET) 5-325 MG per tablet Take 2 tablets by mouth every 4 (four) hours as needed. May take 1-2 tablets every 4-6 hours as needed for pain 11/03/13   Kendra H. Tenny Crawoss, MD   BP 108/70  Pulse 70  Temp(Src) 98.1 F (36.7 C) (Oral)  Resp 16  Ht 5\' 1"  (1.549 m)  Wt 180 lb (81.647 kg)  BMI 34.03 kg/m2  SpO2 100%  LMP 02/17/2014  Breastfeeding? No  Physical Exam General: Well-developed, well-nourished female in no acute distress; appearance consistent with age of record HENT: normocephalic; atraumatic Eyes: pupils equal, round and reactive to light; extraocular muscles intact Neck: supple Heart: regular rate and rhythm Lungs: clear to auscultation bilaterally  Abdomen: soft; nondistended; epigastric and right upper quadrant tenderness; no masses or hepatosplenomegaly; bowel sounds present; gallstone seen on bedside ultrasound:  Extremities: No deformity; full range of motion; pulses normal Neurologic: Awake, alert and oriented; motor function intact in all extremities and symmetric; no facial droop Skin: Warm and dry Psychiatric: Flat affect    ED Course  Procedures (including critical care time)  MDM  Nursing notes and vitals signs, including pulse oximetry, reviewed.  Summary of this visit's results, reviewed by myself:  Labs:  Results for orders placed during the hospital encounter of 03/03/14 (from the past 24  hour(s))  PREGNANCY, URINE     Status: None   Collection Time    03/03/14  5:24 AM      Result Value Ref Range   Preg Test, Ur NEGATIVE  NEGATIVE  URINALYSIS, ROUTINE W REFLEX MICROSCOPIC     Status: Abnormal   Collection Time    03/03/14  5:24 AM      Result Value Ref Range   Color, Urine YELLOW  YELLOW   APPearance CLOUDY (*) CLEAR   Specific Gravity, Urine 1.015  1.005 - 1.030   pH 6.5  5.0 - 8.0   Glucose, UA NEGATIVE  NEGATIVE mg/dL   Hgb urine dipstick NEGATIVE  NEGATIVE   Bilirubin Urine NEGATIVE  NEGATIVE   Ketones, ur NEGATIVE  NEGATIVE mg/dL   Protein, ur NEGATIVE  NEGATIVE mg/dL   Urobilinogen, UA 1.0  0.0 - 1.0 mg/dL   Nitrite NEGATIVE  NEGATIVE   Leukocytes, UA SMALL (*) NEGATIVE  URINE MICROSCOPIC-ADD ON     Status: Abnormal   Collection Time    03/03/14  5:24 AM      Result Value Ref Range   Squamous Epithelial / LPF MANY (*) RARE   WBC, UA 3-6  <3 WBC/hpf   RBC / HPF 0-2  <3 RBC/hpf   Bacteria, UA MANY (*) RARE  CBC WITH DIFFERENTIAL     Status: None   Collection Time    03/03/14  5:45 AM      Result Value Ref Range   WBC 10.0  4.0 - 10.5 K/uL   RBC 4.38  3.87 - 5.11 MIL/uL   Hemoglobin 12.8  12.0 - 15.0 g/dL   HCT 16.1  09.6 - 04.5 %   MCV 89.0  78.0 - 100.0 fL   MCH 29.2  26.0 - 34.0 pg   MCHC 32.8  30.0 - 36.0 g/dL   RDW 40.9  81.1 - 91.4 %   Platelets 206  150 - 400 K/uL   Neutrophils Relative % 71  43 - 77 %   Neutro Abs 7.1  1.7 - 7.7 K/uL   Lymphocytes Relative 20  12 - 46 %   Lymphs Abs 2.0  0.7 - 4.0 K/uL   Monocytes Relative 8  3 - 12 %   Monocytes Absolute 0.8  0.1 - 1.0 K/uL   Eosinophils Relative 1  0 - 5 %   Eosinophils Absolute 0.1  0.0 - 0.7 K/uL   Basophils Relative 0  0 - 1 %   Basophils Absolute 0.0  0.0 - 0.1 K/uL  COMPREHENSIVE METABOLIC PANEL     Status: Abnormal   Collection Time    03/03/14  5:45 AM      Result Value Ref Range   Sodium 141  137 - 147 mEq/L   Potassium 3.5 (*) 3.7 - 5.3 mEq/L   Chloride 102  96 - 112  mEq/L  CO2 26  19 - 32 mEq/L   Glucose, Bld 111 (*) 70 - 99 mg/dL   BUN 10  6 - 23 mg/dL   Creatinine, Ser 1.021.00  0.50 - 1.10 mg/dL   Calcium 9.4  8.4 - 72.510.5 mg/dL   Total Protein 7.6  6.0 - 8.3 g/dL   Albumin 3.9  3.5 - 5.2 g/dL   AST 70 (*) 0 - 37 U/L   ALT 38 (*) 0 - 35 U/L   Alkaline Phosphatase 107  39 - 117 U/L   Total Bilirubin 0.4  0.3 - 1.2 mg/dL   GFR calc non Af Amer 76 (*) >90 mL/min   GFR calc Af Amer 88 (*) >90 mL/min   Anion gap 13  5 - 15  LIPASE, BLOOD     Status: None   Collection Time    03/03/14  5:45 AM      Result Value Ref Range   Lipase 31  11 - 59 U/L    Imaging Studies: [place] Abdomen Complete  03/03/2014   CLINICAL DATA:  Epigastric pain extending into the upper abdomen, with nausea.  EXAM: ULTRASOUND ABDOMEN COMPLETE  COMPARISON:  None.  FINDINGS: Gallbladder: Gallstones measure up to 1.8 cm in length. No gallbladder wall thickening or pericholecystic fluid. Sonographic Murphy's sign absent.  Common bile duct: Diameter: 3 mm  Liver: No focal lesion identified. Within normal limits in parenchymal echogenicity.  IVC: Poorly seen due to overlying bowel gas.  Pancreas: Poorly seen due to overlying bowel gas.  Spleen: Upper normal splenic size.  Right Kidney: Length: 10.4 cm. Echogenicity within normal limits. No mass or hydronephrosis visualized.  Left Kidney: Length: 10.1 cm. Echogenicity within normal limits. No mass or hydronephrosis visualized.  Abdominal aorta: Poorly seen due to overlying bowel gas.  Other findings: None.  IMPRESSION: 1. Chololithiasis. 2. Upper normal splenic size. 3. Bowel gas obscures parts of the IVC, pancreas, and abdominal aorta.   Electronically Signed   By: Herbie BaltimoreWalt  Liebkemann M.D.   On: 03/03/2014 08:24    6:22 AM We'll obtain abdominal ultrasound at 8 AM.    Hanley SeamenJohn L Aaralynn Shepheard, MD 03/03/14 1026

## 2014-03-03 NOTE — ED Provider Notes (Signed)
Care assumed from Dr. Read DriversMolpus at shift change. Patient awaiting an ultrasound to rule out cholecystitis. Ultrasound does show cholelithiasis, however no cholecystitis or evidence for an emergent surgical situation. She appears to be feeling better with medications given in the ER and appears appropriate for discharge. She will be given pain medication and the contact information for [facility] with whom she can arrange a followup appointment.  Geoffery Lyonsouglas Tiaira Arambula, MD 03/03/14 [phone]

## 2014-03-03 NOTE — Discharge Instructions (Signed)
Hydrocodone as prescribed as needed for pain.  Followup with [facility] in the next few days. Call to arrange a followup appointment. The contact information has been provided in this discharge summary.  Return to the emergency department if you develop severe pain, high fever, or other new and concerning symptoms.   Cholelithiasis Cholelithiasis (also called gallstones) is a form of gallbladder disease in which gallstones form in your gallbladder. The gallbladder is an organ that stores bile made in the liver, which helps digest fats. Gallstones begin as small crystals and slowly grow into stones. Gallstone pain occurs when the gallbladder spasms and a gallstone is blocking the duct. Pain can also occur when a stone passes out of the duct.  RISK FACTORS  Being female.   Having multiple pregnancies. Health care providers sometimes advise removing diseased gallbladders before future pregnancies.   Being obese.  Eating a diet heavy in fried foods and fat.   Being older than 60 years and increasing age.   Prolonged use of medicines containing female hormones.   Having diabetes mellitus.   Rapidly losing weight.   Having a family history of gallstones (heredity).  SYMPTOMS  Nausea.   Vomiting.  Abdominal pain.   Yellowing of the skin (jaundice).   Sudden pain. It may persist from several minutes to several hours.  Fever.   Tenderness to the touch. In some cases, when gallstones do not move into the bile duct, people have no pain or symptoms. These are called "silent" gallstones.  TREATMENT Silent gallstones do not need treatment. In severe cases, emergency surgery may be required. Options for treatment include:  Surgery to remove the gallbladder. This is the most common treatment.  Medicines. These do not always work and may take 6-12 months or more to work.  Shock wave treatment (extracorporeal biliary lithotripsy). In this treatment an  ultrasound machine sends shock waves to the gallbladder to break gallstones into smaller pieces that can pass into the intestines or be dissolved by medicine. HOME CARE INSTRUCTIONS   Only take over-the-counter or prescription medicines for pain, discomfort, or fever as directed by your health care provider.   Follow a low-fat diet until seen again by your health care provider. Fat causes the gallbladder to contract, which can result in pain.   Follow up with your health care provider as directed. Attacks are almost always recurrent and surgery is usually required for permanent treatment.  SEEK IMMEDIATE MEDICAL CARE IF:   Your pain increases and is not controlled by medicines.   You have a fever or persistent symptoms for more than 2-3 days.   You have a fever and your symptoms suddenly get worse.   You have persistent nausea and vomiting.  MAKE SURE YOU:   Understand these instructions.  Will watch your condition.  Will get help right away if you are not doing well or get worse. Document Released: 04/25/2005 Document Revised: 12/30/2012 Document Reviewed: 10/21/2012 [facility] Patient Information 2015 [employer] [place]. This information is not intended to replace advice given to you by your health care provider. Make sure you discuss any questions you have with your health care provider.

## 2014-03-03 NOTE — ED Notes (Signed)
Patient transported to Ultrasound 

## 2014-03-03 NOTE — ED Notes (Signed)
Pt with mid epigastric pain that radiates to both sides of her flank and her back, no nausea or vomiting

## 2014-03-14 ENCOUNTER — Encounter (HOSPITAL_BASED_OUTPATIENT_CLINIC_OR_DEPARTMENT_OTHER): Payer: Self-pay | Admitting: Emergency Medicine

## 2014-03-18 ENCOUNTER — Other Ambulatory Visit (INDEPENDENT_AMBULATORY_CARE_PROVIDER_SITE_OTHER): Payer: Self-pay | Admitting: Surgery

## 2014-04-13 ENCOUNTER — Other Ambulatory Visit (INDEPENDENT_AMBULATORY_CARE_PROVIDER_SITE_OTHER): Payer: Self-pay | Admitting: Surgery

## 2014-04-30 ENCOUNTER — Emergency Department (HOSPITAL_BASED_OUTPATIENT_CLINIC_OR_DEPARTMENT_OTHER)
Admission: EM | Admit: 2014-04-30 | Discharge: 2014-04-30 | Disposition: A | Discharge status: Home / Self Care | Payer: Medicaid Other | Attending: Emergency Medicine | Admitting: Emergency Medicine

## 2014-04-30 ENCOUNTER — Encounter (HOSPITAL_BASED_OUTPATIENT_CLINIC_OR_DEPARTMENT_OTHER): Payer: Self-pay | Admitting: *Deleted

## 2014-04-30 ENCOUNTER — Emergency Department (HOSPITAL_BASED_OUTPATIENT_CLINIC_OR_DEPARTMENT_OTHER): Payer: Medicaid Other

## 2014-04-30 DIAGNOSIS — N390 Urinary tract infection, site not specified: Secondary | ICD-10-CM | POA: Diagnosis not present

## 2014-04-30 DIAGNOSIS — Z8619 Personal history of other infectious and parasitic diseases: Secondary | ICD-10-CM | POA: Diagnosis not present

## 2014-04-30 DIAGNOSIS — Z880 Allergy status to penicillin: Secondary | ICD-10-CM | POA: Diagnosis not present

## 2014-04-30 DIAGNOSIS — Z8659 Personal history of other mental and behavioral disorders: Secondary | ICD-10-CM | POA: Insufficient documentation

## 2014-04-30 DIAGNOSIS — R109 Unspecified abdominal pain: Secondary | ICD-10-CM

## 2014-04-30 DIAGNOSIS — Z87891 Personal history of nicotine dependence: Secondary | ICD-10-CM | POA: Diagnosis not present

## 2014-04-30 DIAGNOSIS — Z9889 Other specified postprocedural states: Secondary | ICD-10-CM | POA: Insufficient documentation

## 2014-04-30 DIAGNOSIS — R1030 Lower abdominal pain, unspecified: Secondary | ICD-10-CM

## 2014-04-30 DIAGNOSIS — R1084 Generalized abdominal pain: Secondary | ICD-10-CM | POA: Diagnosis present

## 2014-04-30 DIAGNOSIS — Z3202 Encounter for pregnancy test, result negative: Secondary | ICD-10-CM | POA: Diagnosis not present

## 2014-04-30 LAB — CBC WITH DIFFERENTIAL/PLATELET
BASOS PCT: 0 % (ref 0–1)
Basophils Absolute: 0 10*3/uL (ref 0.0–0.1)
EOS ABS: 0.1 10*3/uL (ref 0.0–0.7)
EOS PCT: 1 % (ref 0–5)
HCT: 37.7 % (ref 36.0–46.0)
Hemoglobin: 12.4 g/dL (ref 12.0–15.0)
LYMPHS ABS: 2.2 10*3/uL (ref 0.7–4.0)
Lymphocytes Relative: 22 % (ref 12–46)
MCH: 29.6 pg (ref 26.0–34.0)
MCHC: 32.9 g/dL (ref 30.0–36.0)
MCV: 90 fL (ref 78.0–100.0)
MONOS PCT: 8 % (ref 3–12)
Monocytes Absolute: 0.8 10*3/uL (ref 0.1–1.0)
Neutro Abs: 6.8 10*3/uL (ref 1.7–7.7)
Neutrophils Relative %: 69 % (ref 43–77)
Platelets: 214 10*3/uL (ref 150–400)
RBC: 4.19 MIL/uL (ref 3.87–5.11)
RDW: 13.4 % (ref 11.5–15.5)
WBC: 9.7 10*3/uL (ref 4.0–10.5)

## 2014-04-30 LAB — URINALYSIS, ROUTINE W REFLEX MICROSCOPIC
BILIRUBIN URINE: NEGATIVE
Glucose, UA: NEGATIVE mg/dL
KETONES UR: NEGATIVE mg/dL
Nitrite: NEGATIVE
PH: 5.5 (ref 5.0–8.0)
PROTEIN: NEGATIVE mg/dL
Specific Gravity, Urine: 1.027 (ref 1.005–1.030)
Urobilinogen, UA: 1 mg/dL (ref 0.0–1.0)

## 2014-04-30 LAB — COMPREHENSIVE METABOLIC PANEL
ALBUMIN: 4 g/dL (ref 3.5–5.2)
ALT: 13 U/L (ref 0–35)
ANION GAP: 10 (ref 5–15)
AST: 15 U/L (ref 0–37)
Alkaline Phosphatase: 112 U/L (ref 39–117)
BUN: 13 mg/dL (ref 6–23)
CALCIUM: 9.5 mg/dL (ref 8.4–10.5)
CO2: 25 mEq/L (ref 19–32)
Chloride: 105 mEq/L (ref 96–112)
Creatinine, Ser: 0.9 mg/dL (ref 0.50–1.10)
GFR calc non Af Amer: 86 mL/min — ABNORMAL LOW (ref >90)
Glucose, Bld: 100 mg/dL — ABNORMAL HIGH (ref 70–99)
Potassium: 4.3 mEq/L (ref 3.7–5.3)
Sodium: 140 mEq/L (ref 137–147)
TOTAL PROTEIN: 7.7 g/dL (ref 6.0–8.3)
Total Bilirubin: 0.3 mg/dL (ref 0.3–1.2)

## 2014-04-30 LAB — URINE MICROSCOPIC-ADD ON

## 2014-04-30 LAB — LIPASE, BLOOD: LIPASE: 39 U/L (ref 11–59)

## 2014-04-30 LAB — PREGNANCY, URINE: Preg Test, Ur: NEGATIVE

## 2014-04-30 MED ORDER — CEPHALEXIN 250 MG PO CAPS
500.0000 mg | ORAL_CAPSULE | Freq: Once | ORAL | Status: AC
  Administered 2014-04-30: 500 mg via ORAL
  Filled 2014-04-30: qty 2

## 2014-04-30 MED ORDER — ONDANSETRON HCL 4 MG/2ML IJ SOLN
4.0000 mg | Freq: Once | INTRAMUSCULAR | Status: AC
  Administered 2014-04-30: 4 mg via INTRAVENOUS
  Filled 2014-04-30: qty 2

## 2014-04-30 MED ORDER — IOHEXOL 300 MG/ML  SOLN
25.0000 mL | Freq: Once | INTRAMUSCULAR | Status: AC | PRN
  Administered 2014-04-30: 25 mL via ORAL

## 2014-04-30 MED ORDER — SODIUM CHLORIDE 0.9 % IV SOLN
1000.0000 mL | INTRAVENOUS | Status: DC
  Administered 2014-04-30: 1000 mL via INTRAVENOUS

## 2014-04-30 MED ORDER — CEPHALEXIN 500 MG PO CAPS: 500.0000 mg | ORAL_CAPSULE | Freq: Four times a day (QID) | ORAL | Status: DC

## 2014-04-30 MED ORDER — ONDANSETRON 8 MG PO TBDP: 8.0000 mg | ORAL_TABLET | Freq: Three times a day (TID) | ORAL | Status: DC | PRN

## 2014-04-30 MED ORDER — SODIUM CHLORIDE 0.9 % IV SOLN
1000.0000 mL | Freq: Once | INTRAVENOUS | Status: AC
  Administered 2014-04-30: 1000 mL via INTRAVENOUS

## 2014-04-30 MED ORDER — IOHEXOL 300 MG/ML  SOLN
100.0000 mL | Freq: Once | INTRAMUSCULAR | Status: AC | PRN
  Administered 2014-04-30: 100 mL via INTRAVENOUS

## 2014-04-30 NOTE — ED Notes (Signed)
Pt reports generalized abdominal pain x 1 day.  Denies urinary symptoms.  Pt reports that her grandfather died this morning.

## 2014-04-30 NOTE — Discharge Instructions (Signed)
Abdominal Pain, Women °Abdominal (stomach, pelvic, or belly) pain can be caused by many things. It is important to tell your doctor: °· The location of the pain. °· Does it come and go or is it present all the time? °· Are there things that start the pain (eating certain foods, exercise)? °· Are there other symptoms associated with the pain (fever, nausea, vomiting, diarrhea)? °All of this is helpful to know when trying to find the cause of the pain. °CAUSES  °· Stomach: virus or bacteria infection, or ulcer. °· Intestine: appendicitis (inflamed appendix), regional ileitis (Crohn's disease), ulcerative colitis (inflamed colon), irritable bowel syndrome, diverticulitis (inflamed diverticulum of the colon), or cancer of the stomach or intestine. °· Gallbladder disease or stones in the gallbladder. °· Kidney disease, kidney stones, or infection. °· Pancreas infection or cancer. °· Fibromyalgia (pain disorder). °· Diseases of the female organs: °· Uterus: fibroid (non-cancerous) tumors or infection. °· Fallopian tubes: infection or tubal pregnancy. °· Ovary: cysts or tumors. °· Pelvic adhesions (scar tissue). °· Endometriosis (uterus lining tissue growing in the pelvis and on the pelvic organs). °· Pelvic congestion syndrome (female organs filling up with blood just before the menstrual period). °· Pain with the menstrual period. °· Pain with ovulation (producing an egg). °· Pain with an IUD (intrauterine device, birth control) in the uterus. °· Cancer of the female organs. °· Functional pain (pain not caused by a disease, may improve without treatment). °· Psychological pain. °· Depression. °DIAGNOSIS  °Your doctor will decide the seriousness of your pain by doing an examination. °· Blood tests. °· X-rays. °· Ultrasound. °· CT scan (computed tomography, special type of X-Meredith Kilbride). °· MRI (magnetic resonance imaging). °· Cultures, for infection. °· Barium enema (dye inserted in the large intestine, to better view it with  X-rays). °· Colonoscopy (looking in intestine with a lighted tube). °· Laparoscopy (minor surgery, looking in abdomen with a lighted tube). °· Major abdominal exploratory surgery (looking in abdomen with a large incision). °TREATMENT  °The treatment will depend on the cause of the pain.  °· Many cases can be observed and treated at home. °· Over-the-counter medicines recommended by your caregiver. °· Prescription medicine. °· Antibiotics, for infection. °· Birth control pills, for painful periods or for ovulation pain. °· Hormone treatment, for endometriosis. °· Nerve blocking injections. °· Physical therapy. °· Antidepressants. °· Counseling with a psychologist or psychiatrist. °· Minor or major surgery. °HOME CARE INSTRUCTIONS  °· Do not take laxatives, unless directed by your caregiver. °· Take over-the-counter pain medicine only if ordered by your caregiver. Do not take aspirin because it can cause an upset stomach or bleeding. °· Try a clear liquid diet (broth or water) as ordered by your caregiver. Slowly move to a bland diet, as tolerated, if the pain is related to the stomach or intestine. °· Have a thermometer and take your temperature several times a day, and record it. °· Bed rest and sleep, if it helps the pain. °· Avoid sexual intercourse, if it causes pain. °· Avoid stressful situations. °· Keep your follow-up appointments and tests, as your caregiver orders. °· If the pain does not go away with medicine or surgery, you may try: °¨ Acupuncture. °¨ Relaxation exercises (yoga, meditation). °¨ Group therapy. °¨ Counseling. °SEEK MEDICAL CARE IF:  °· You notice certain foods cause stomach pain. °· Your home care treatment is not helping your pain. °· You need stronger pain medicine. °· You want your IUD removed. °· You feel faint or   lightheaded. °· You develop nausea and vomiting. °· You develop a rash. °· You are having side effects or an allergy to your medicine. °SEEK IMMEDIATE MEDICAL CARE IF:  °· Your  pain does not go away or gets worse. °· You have a fever. °· Your pain is felt only in portions of the abdomen. The right side could possibly be appendicitis. The left lower portion of the abdomen could be colitis or diverticulitis. °· You are passing blood in your stools (bright red or black tarry stools, with or without vomiting). °· You have blood in your urine. °· You develop chills, with or without a fever. °· You pass out. °MAKE SURE YOU:  °· Understand these instructions. °· Will watch your condition. °· Will get help right away if you are not doing well or get worse. °Document Released: 02/24/2007 Document Revised: 09/13/2013 Document Reviewed: 03/16/2009 °[employer]® Patient Information ©2015 [employer]. This information is not intended to replace advice given to you by your health care provider. Make sure you discuss any questions you have with your health care provider. ° °Urinary Tract Infection °A urinary tract infection (UTI) can occur any place along the urinary tract. The tract includes the kidneys, ureters, bladder, and urethra. A type of germ called bacteria often causes a UTI. UTIs are often helped with antibiotic medicine.  °HOME CARE  °· If given, take antibiotics as told by your doctor. Finish them even if you start to feel better. °· Drink enough fluids to keep your pee (urine) clear or pale yellow. °· Avoid tea, drinks with caffeine, and bubbly (carbonated) drinks. °· Pee often. Avoid holding your pee in for a long time. °· Pee before and after having sex (intercourse). °· Wipe from front to back after you poop (bowel movement) if you are a woman. Use each tissue only once. °GET HELP RIGHT AWAY IF:  °· You have back pain. °· You have lower belly (abdominal) pain. °· You have chills. °· You feel sick to your stomach (nauseous). °· You throw up (vomit). °· Your burning or discomfort with peeing does not go away. °· You have a fever. °· Your symptoms are not better in 3 days. °MAKE SURE YOU:    °· Understand these instructions. °· Will watch your condition. °· Will get help right away if you are not doing well or get worse. °Document Released: 10/16/2007 Document Revised: 01/22/2012 Document Reviewed: 11/28/2011 °[employer]® Patient Information ©2015 [employer]. This information is not intended to replace advice given to you by your health care provider. Make sure you discuss any questions you have with your health care provider. ° °

## 2014-04-30 NOTE — ED Provider Notes (Signed)
CSN: [id number]     Arrival date & time 04/30/14  1038 History   First MD Initiated Contact with Patient 04/30/14 1138     Chief Complaint  Patient presents with  . Abdominal Pain     (Consider location/radiation/quality/duration/timing/severity/associated sxs/prior Treatment) HPI  28 year old female comes in today complaining of some generalized crampy abdominal pain that began at approximately 7 AM this morning. She states that she was up all night grandfather died of possibly 6:30 this morning. She feels like it could be a urinary tract infection but denies frequency or pain with urination. She states that she had her gallbladder out approximately 2 weeks ago. She has been eating and drinking without any difficulty.  She is not sure when her last period was but does not think that she is currently pregnant as she denies any signs or symptoms of pregnancy. She has been pregnant 4 times and has 4 children. She denies any abnormal vaginal discharge, history of STDs, or pain intercourse.  Past Medical History  Diagnosis Date  . Smoker   . H/O varicella   . HPV (human papilloma virus) anogenital infection   . Panic attack   . Abnormal Pap smear    Past Surgical History  Procedure Laterality Date  . Mandible surgery    . Cryotherapy  2008 cervix   Family History  Problem Relation Age of Onset  . Other Neg Hx   . COPD Maternal Grandmother   . Diabetes Paternal Grandmother   . Heart disease Father   . COPD Paternal Grandfather    History  Substance Use Topics  . Smoking status: Former Smoker    Quit date: 12/11/2010  . Smokeless tobacco: Never Used  . Alcohol Use: No   OB History    Gravida Para Term Preterm AB TAB SAB Ectopic Multiple Living   4 4 3 1      4      Review of Systems  All other systems reviewed and are negative.     Allergies  Penicillins  Home Medications   Prior to Admission medications   Not on File   BP 128/77 mmHg  Pulse 75  Temp(Src) 97.7  F (36.5 C) (Oral)  Resp 18  Ht 5\' 1"  (1.549 m)  Wt 185 lb (83.915 kg)  BMI 34.97 kg/m2  SpO2 100%  LMP 04/23/2014 Physical Exam  Constitutional: She is oriented to person, place, and time. She appears well-developed and well-nourished.  HENT:  Head: Normocephalic and atraumatic.  Right Ear: External ear normal.  Left Ear: External ear normal.  Nose: Nose normal.  Mouth/Throat: Oropharynx is clear and moist.  Eyes: Conjunctivae and EOM are normal. Pupils are equal, round, and reactive to light.  Neck: Normal range of motion. Neck supple.  Cardiovascular: Normal rate, regular rhythm and normal heart sounds.   Pulmonary/Chest: Effort normal and breath sounds normal.  Abdominal: Soft. Bowel sounds are normal.  Musculoskeletal: Normal range of motion.  Neurological: She is alert and oriented to person, place, and time. She has normal reflexes.  Skin: Skin is warm and dry.  Psychiatric: She has a normal mood and affect. Her behavior is normal. Judgment and thought content normal.  Nursing note and vitals reviewed.   ED Course  Procedures (including critical care time) Labs Review Labs Reviewed  URINALYSIS, ROUTINE W REFLEX MICROSCOPIC - Abnormal; Notable for the following:    APPearance CLOUDY (*)    Hgb urine dipstick MODERATE (*)    Leukocytes, UA MODERATE (*)  All other components within normal limits  COMPREHENSIVE METABOLIC PANEL - Abnormal; Notable for the following:    Glucose, Bld 100 (*)    GFR calc non Af Amer 86 (*)    All other components within normal limits  URINE MICROSCOPIC-ADD ON - Abnormal; Notable for the following:    Squamous Epithelial / LPF MANY (*)    Bacteria, UA MANY (*)    All other components within normal limits  PREGNANCY, URINE  CBC WITH DIFFERENTIAL  LIPASE, BLOOD    Imaging Review Ct Abdomen Pelvis W Contrast  04/30/2014   CLINICAL DATA:  One day history of abdominal pain and abdominal distention. Status postcholecystectomy 2 weeks  prior  EXAM: CT ABDOMEN AND PELVIS WITH CONTRAST  TECHNIQUE: Multidetector CT imaging of the abdomen and pelvis was performed using the standard protocol following bolus administration of intravenous contrast. Oral contrast was also administered.  CONTRAST:  100mL OMNIPAQUE IOHEXOL 300 MG/ML SOLN, 25mL OMNIPAQUE IOHEXOL 300 MG/ML SOLN  COMPARISON:  None.  FINDINGS: Lung bases are clear.  No focal liver lesions are identified. There is linear calcification along the medial inferior aspect of the right lobe of the liver at the level of the hepatorenal fossa. The gallbladder is absent. There is no fluid or inflammation in the gallbladder fossa region. There is no biliary duct dilatation.  Spleen, pancreas, and adrenals appear normal. There is a 4 mm calculus in the lower pole of the right kidney, nonobstructing. No other intrarenal calculi are identified. There is no hydronephrosis on either side. There is no ureteral calculus on either side.  In the pelvis, the urinary bladder is midline with normal wall thickness. There is no pelvic mass or fluid. The appendix appears normal.  There is no bowel obstruction.  No free air or portal venous air.  There is no ascites, adenopathy, or abscess in the abdomen or pelvis. There is no demonstrable abdominal aortic aneurysm. There are no blastic or lytic bone lesions.  IMPRESSION: Nonobstructing 4 mm calculus lower pole right kidney. No hydronephrosis on either side. No ureteral calculus on either side.  Appendix appears normal.  No bowel obstruction.  No abscess.  Calcification along the liver edge in the hepatorenal fossa region. Most likely, this finding is prior inflammation in this area.  Gallbladder is absent. No inflammation is noted in the gallbladder fossa region.   Electronically Signed   By: Bretta BangWilliam  Woodruff M.D.   On: 04/30/2014 13:42     EKG Interpretation None      MDM   Final diagnoses:  Abdominal pain  UTI (lower urinary tract infection)     28 year old female presents today status post gallbladder surgery 2 weeks ago with generalized abdominal pain. CT he reveals no evidence of acute abnormality. She does have some white blood cells on her urine and will be treated with Keflex for urinary tract infection. Patient feels improved here and abdomen is soft and nontender with no indications for surgery at this time.    Hilario Quarryanielle S Brycelyn Gambino, MD 05/01/14 [phone]

## 2015-04-16 ENCOUNTER — Emergency Department (HOSPITAL_BASED_OUTPATIENT_CLINIC_OR_DEPARTMENT_OTHER)
Admission: EM | Admit: 2015-04-16 | Discharge: 2015-04-16 | Disposition: A | Discharge status: Home / Self Care | Payer: Medicaid Other | Attending: Emergency Medicine | Admitting: Emergency Medicine

## 2015-04-16 ENCOUNTER — Encounter (HOSPITAL_BASED_OUTPATIENT_CLINIC_OR_DEPARTMENT_OTHER): Payer: Self-pay | Admitting: Emergency Medicine

## 2015-04-16 DIAGNOSIS — Z8619 Personal history of other infectious and parasitic diseases: Secondary | ICD-10-CM | POA: Insufficient documentation

## 2015-04-16 DIAGNOSIS — K0889 Other specified disorders of teeth and supporting structures: Secondary | ICD-10-CM | POA: Diagnosis present

## 2015-04-16 DIAGNOSIS — Z8659 Personal history of other mental and behavioral disorders: Secondary | ICD-10-CM | POA: Insufficient documentation

## 2015-04-16 DIAGNOSIS — Z792 Long term (current) use of antibiotics: Secondary | ICD-10-CM | POA: Insufficient documentation

## 2015-04-16 DIAGNOSIS — Z880 Allergy status to penicillin: Secondary | ICD-10-CM | POA: Insufficient documentation

## 2015-04-16 DIAGNOSIS — Z87891 Personal history of nicotine dependence: Secondary | ICD-10-CM | POA: Diagnosis not present

## 2015-04-16 MED ORDER — IBUPROFEN 800 MG PO TABS: 800.0000 mg | ORAL_TABLET | Freq: Three times a day (TID) | ORAL | Status: DC

## 2015-04-16 MED ORDER — KETOROLAC TROMETHAMINE 60 MG/2ML IM SOLN
60.0000 mg | Freq: Once | INTRAMUSCULAR | Status: AC
  Administered 2015-04-16: 60 mg via INTRAMUSCULAR
  Filled 2015-04-16: qty 2

## 2015-04-16 MED ORDER — AMOXICILLIN 500 MG PO CAPS: 500.0000 mg | ORAL_CAPSULE | Freq: Three times a day (TID) | ORAL | Status: DC

## 2015-04-16 NOTE — ED Provider Notes (Signed)
CSN: [id number]     Arrival date & time 04/16/15  0003 History  By signing my name below, I, Budd PalmerVanessa Prueter, attest that this documentation has been prepared under the direction and in the presence of Eber HongBrian Felina Tello, MD. Electronically Signed: Budd PalmerVanessa Prueter, ED Scribe. 04/16/2015. 12:54 AM.    Chief Complaint  Patient presents with  . Dental Pain   The history is provided by the patient. No language interpreter was used.   HPI Comments: Nancy Palmer is a 29 y.o. female former smoker with a PSHx of mandible surgery who presents to the Emergency Department complaining of constant, aching, worsening left upper and right lower dental pain onset 4 days ago. She notes she has been taking ibuprofen and tylenol every 4 hours and has had to increase to every 3 hours due to the severity of the pain. She states she has also been using Orajel as well as 2 old amoxicillin pills 8 hours ago without ill effects. She notes exacerbation of the pain with eating. Pt denies fever and cough.  Pt is allergic to penicillins.   Past Medical History  Diagnosis Date  . Smoker   . H/O varicella   . HPV (human papilloma virus) anogenital infection   . Panic attack   . Abnormal Pap smear    Past Surgical History  Procedure Laterality Date  . Mandible surgery    . Cryotherapy  2008 cervix   Family History  Problem Relation Age of Onset  . Other Neg Hx   . COPD Maternal Grandmother   . Diabetes Paternal Grandmother   . Heart disease Father   . COPD Paternal Grandfather    Social History  Substance Use Topics  . Smoking status: Former Smoker    Quit date: 12/11/2010  . Smokeless tobacco: Never Used  . Alcohol Use: No   OB History    Gravida Para Term Preterm AB TAB SAB Ectopic Multiple Living   4 4 3 1      4      Review of Systems  Constitutional: Negative for fever.  HENT: Positive for dental problem.   All other systems reviewed and are negative.   Allergies  Penicillins  Home Medications    Prior to Admission medications   Medication Sig Start Date End Date Taking? Authorizing Provider  amoxicillin (AMOXIL) 500 MG capsule Take 1 capsule (500 mg total) by mouth 3 (three) times daily. 04/16/15   Eber HongBrian Raeqwon Lux, MD  cephALEXin (KEFLEX) 500 MG capsule Take 1 capsule (500 mg total) by mouth 4 (four) times daily. 04/30/14   Margarita Grizzleanielle Ray, MD  ibuprofen (ADVIL,MOTRIN) 800 MG tablet Take 1 tablet (800 mg total) by mouth 3 (three) times daily. 04/16/15   Eber HongBrian Charell Faulk, MD  ondansetron (ZOFRAN ODT) 8 MG disintegrating tablet Take 1 tablet (8 mg total) by mouth every 8 (eight) hours as needed for nausea or vomiting. 04/30/14   Margarita Grizzleanielle Ray, MD   BP 124/57 mmHg  Pulse 89  Temp(Src) 97.9 F (36.6 C) (Oral)  Resp 18  Ht 5\' 1"  (1.549 m)  Wt 180 lb (81.647 kg)  BMI 34.03 kg/m2  SpO2 100%  LMP 03/17/2015 Physical Exam  Constitutional: She appears well-developed and well-nourished.  HENT:  Head: Normocephalic and atraumatic.  Deep fractures to lower right and upper left 1st molar, no swelling or redness of the surrounding gums. No trismus or tort  Eyes: Conjunctivae are normal. Right eye exhibits no discharge. Left eye exhibits no discharge.  Cardiovascular: Normal rate,  regular rhythm and normal heart sounds.   Pulmonary/Chest: Effort normal. No respiratory distress.  Lymphadenopathy:    She has no cervical adenopathy.  Neurological: She is alert. Coordination normal.  Skin: Skin is warm and dry. No rash noted. She is not diaphoretic. No erythema.  Psychiatric: She has a normal mood and affect.  Nursing note and vitals reviewed.   ED Course  Procedures  DIAGNOSTIC STUDIES: Oxygen Saturation is 100% on RA, normal by my interpretation.    COORDINATION OF CARE: 12:54 AM - Discussed plans to order an antibiotic. Will refer to a dentist. Pt advised of plan for treatment and pt agrees.  Labs Review Labs Reviewed - No data to display  Imaging Review No results found.   MDM    Final diagnoses:  Tooth ache    Well appearing, no signs of ludwigs - no tris or tort  Filed Vitals:   04/16/15 0012  BP: 124/57  Pulse: 89  Temp: 97.9 F (36.6 C)  TempSrc: Oral  Resp: 18  Height:  (1.549 m)  Weight: 180 lb (81.647 kg)  SpO2: 100%   Meds given in ED:  Medications  ketorolac (TORADOL) injection 60 mg (not administered)    New Prescriptions   AMOXICILLIN (AMOXIL) 500 MG CAPSULE    Take 1 capsule (500 mg total) by mouth 3 (three) times daily.   IBUPROFEN (ADVIL,MOTRIN) 800 MG TABLET    Take 1 tablet (800 mg total) by mouth 3 (three) times daily.      I personally performed the services described in this documentation, which was scribed in my presence. The recorded information has been reviewed and is accurate.       Eber Hong, MD 04/16/15 [phone]

## 2015-04-16 NOTE — Discharge Instructions (Signed)
Please obtain all of your results from medical records or have your doctors office obtain the results - share them with your doctor - you should be seen at your doctors office in the next 2 days. Call today to arrange your follow up. Take the medications as prescribed. Please review all of the medicines and only take them if you do not have an allergy to them. Please be aware that if you are taking birth control pills, taking other prescriptions, ESPECIALLY ANTIBIOTICS may make the birth control ineffective - if this is the case, either do not engage in sexual activity or use alternative methods of birth control such as condoms until you have finished the medicine and your family doctor says it is OK to restart them. If you are on a blood thinner such as COUMADIN, be aware that any other medicine that you take may cause the coumadin to either work too much, or not enough - you should have your coumadin level rechecked in next 7 days if this is the case.  °?  °It is also a possibility that you have an allergic reaction to any of the medicines that you have been prescribed - Everybody reacts differently to medications and while MOST people have no trouble with most medicines, you may have a reaction such as nausea, vomiting, rash, swelling, shortness of breath. If this is the case, please stop taking the medicine immediately and contact your physician.  °?  °You should return to the ER if you develop severe or worsening symptoms.  ° ° °Emergency Department Resource Guide °1) Find a Doctor and Pay Out of Pocket °Although you won't have to find out who is covered by your insurance plan, it is a good idea to ask around and get recommendations. You will then need to call the office and see if the doctor you have chosen will accept you as a new patient and what types of options they offer for patients who are self-pay. Some doctors offer discounts or will set up payment plans for their patients who do not have insurance,  but you will need to ask so you aren't surprised when you get to your appointment. ° °2) Contact Your Local Health Department °Not all health departments have doctors that can see patients for sick visits, but many do, so it is worth a call to see if yours does. If you don't know where your local health department is, you can check in your phone book. The CDC also has a tool to help you locate your state's health department, and many state websites also have listings of all of their local health departments. ° °3) Find a Walk-in Clinic °If your illness is not likely to be very severe or complicated, you may want to try a walk in clinic. These are popping up all over the country in pharmacies, drugstores, and shopping centers. They're usually staffed by nurse practitioners or physician assistants that have been trained to treat common illnesses and complaints. They're usually fairly quick and inexpensive. However, if you have serious medical issues or chronic medical problems, these are probably not your best option. ° °No Primary Care Doctor: °- Call [facility] at  832-8000 - they can help you locate a primary care doctor that  accepts your insurance, provides certain services, etc. °- Physician Referral Service- 1-800-533-3463 ° °Chronic Pain Problems: °Organization         Address  Phone   Notes  °[facility]  (336)   297-2271 Patients need to be referred by their primary care doctor.  ° °Medication Assistance: °Organization         Address  Phone   Notes  °[place] [facility] [address] °[place], [place] [address] (336) 641-8030 --Must be a resident of [place] °-- Must have NO insurance coverage whatsoever (no Medicaid/ Medicare, etc.) °-- The pt. MUST have a primary care doctor that directs their care regularly and follows them in the community °  °[facility]  (866) 331-1348   °[employer]  (888) 892-1162   ° °Agencies that provide inexpensive  medical care: °Organization         Address  Phone   Notes  °[facility]  (336) 832-8035   °[facility]    (336) 832-7272   °[facility] [address] °[place], [place] [address] (336) 832-4777   °[facility] [address], °[place] (336) 271-4999   °[facility]    (336) 373-0678   °[facility]    (336) 272-1050   °[facility] ° [address], [place] Phone:  (336) 832-4444, Fax:  (336) 832-4440 Hours of Operation:  9 am - 6 pm, M-F.  Also accepts Medicaid/Medicare and self-pay.  °[facility] ° [address], [place] Phone: (336) 832-3150, Fax: (336) 832-3151. Hours of Operation:  8:30 am - 5:30 pm, M-F.  Also accepts Medicaid and self-pay.  °[facility] [place] [address], [place] Phone: (336) 878-6027   °[facility] [address], [place], [place] (336)723-1848, Ext. 123 Mondays & Thursdays: 7-9 AM.  First 15 patients are seen on a first come, first serve basis. °  ° °Medicaid-accepting [place] Providers: ° °Organization         Address  Phone   Notes  °[facility] [address], [place] (336) 641-2100 Also accepts self-pay patients.  °[facility] [address], [place] ° (336) 856-9996   °[facility] [address], [place] (336) 288-8857   °[facility] [address] [place] [address], [place] (336) 299-7000   °Veita Bland [address], [place]  ° (336) 373-1557 Only accepts [place] Access Medicaid patients after they have their name applied to their card.  ° °Self-Pay (no insurance) in [place]: ° °Organization         Address  Phone   Notes  °Sickle Cell Patients, [facility] [address], [place] (336) 832-1970   °[facility] [address], [place] (336) 832-4400   °[facility] ° [address], [place] (336) 992-4800   °[facility]/Dr. Osei-Bonsu ° [address] [place] [address], [place] or [address], [place] (336) 841-8500 Phone number for both [place] and [place] locations is the same.  °[facility] [address], [place] (336) 299-0000   °[facility] [address] [place] [address], [place] or [address] (336) 852-7530 °(336) 878-2260   °[facility] [address], [place] (336) 350-1642, phone; (336) 294-5005, fax Sees patients 1st and 3rd Saturday of every month.  Must not qualify for public or private insurance (i.e. Medicaid, Medicare, [place] Health Choice, Veterans' Benefits) •   Household income should be no more than 200% of the poverty level •The clinic cannot treat you if you are pregnant or think you are pregnant • Sexually transmitted diseases are not treated at the clinic.  ° ° °Dental Care: °Organization         Address  Phone  Notes  °[facility] [address], [place] (336) 641-6152 Accepts children up to age 21 who are enrolled in Medicaid or [place] Health Choice; pregnant women with a Medicaid card; and children who have applied for Medicaid or [place] Health Choice, but were declined, whose parents can pay a reduced fee at time of service.  °[facility] [place]  [address], [place] (336) 641-7733 Accepts children up to age 21 who are enrolled in Medicaid or [place] Health Choice; pregnant women with a Medicaid card; and children who have applied for Medicaid or [place] Health Choice, but were declined, whose parents can pay a reduced fee at time of service.  °[facility] ° [address], [place] (336) 641-4533 Patients are seen by appointment only. Walk-ins are not accepted.  [facility] will see patients 18 years of age and older. °Monday - Tuesday (8am-5pm) °Most Wednesdays (8:30-5pm) °$30 per visit, cash only  °[facility] ° [address], [place] (336) 641-4533 Patients are seen by appointment only. Walk-ins are not accepted. [facility] will see patients 18 years of age and older. °One Wednesday Evening (Monthly: Volunteer Based).  $30 per visit, cash only  °[facility]  (919) 537-3737 for adults; Children under age 4, call Graduate Pediatric Dentistry at (919) [phone]. Children aged 4-14, please call (919) [phone] to request a pediatric application. ° Dental services are provided in all areas of dental care including fillings, crowns and bridges, complete and partial dentures, implants, gum treatment, root canals, and extractions. Preventive care is also provided. Treatment is provided to both adults and children. °Patients are selected via a lottery and there is often a waiting list. °  °[facility] [address], °[place] ° (336) 763-8833 [email or web address] °  °[facility] [address], [place], [place] (336)723-1848, Ext. 123 Second and Fourth Thursday of each month, opens at 6:30 AM; Clinic ends at 9 AM.  Patients are seen on a first-come first-served basis, and a limited number are seen during each clinic.  ° °[facility] ° [address], [place], [place] (336) 723-7904   Eligibility Requirements °You must have lived in [place], [place], or [place] counties for at least the last three months. °  You cannot be eligible for state or federal sponsored healthcare insurance, including Veterans Administration, Medicaid, or Medicare. °  You generally cannot be eligible for healthcare insurance through your employer.  °  How to apply: °Eligibility screenings are held every Tuesday and Wednesday afternoon from 1:00 pm until 4:00 pm. You do not need an appointment for the  interview!  °[facility] [address], [place], [place] 336-631-2330   °[facility]  336-342-8273   °[facility]  336-703-3100   °[facility]  336-570-6415   ° °Behavioral Health Resources in the Community: °Intensive Outpatient Programs °Organization         Address  Phone  Notes  °[facility] [address], [place],   [place] 336-878-6098   °[facility] [address], [place], [place] 336-832-9800   °[facility] [address], [place], [place] ° 336-882-2125   °[facility] [address],  °[place], [place] 1-800-853-5163 or 336-641-4981   °Substance Abuse Resources °Organization         Address  Phone  Notes  °[facility]  336-882-2125   °[facility]  336-784-9470   °[facility]  336-285-9073   °[facility]  336-845-3988   °Residential & Outpatient Substance Abuse Program  1-800-659-3381   °Psychological Services °Organization         Address  Phone  Notes  °[facility]  336- 832-9600   °[facility]  336- 378-7881   °[facility] [address], [place] 1-800-853-5163 or 336-641-4981   ° °Mobile Crisis Teams °Organization         Address  Phone  Notes  °[facility], Mobile Crisis Care Unit  1-877-626-1772   °Assertive °[facility] ° [address] [place], [place] 336-834-9664   °Sharon DeEsch [address] °[place] 336-554-5454   ° °Self-Help/Support Groups °Organization         Address  Phone             Notes  °[facility] [place] - variety of support groups  336- 373-1402 Call for more information  °Narcotics Anonymous (NA), [facility] [address], °[place]  2 meetings at this location  ° °Residential Treatment Programs °Organization         Address  Phone  Notes  °[facility]  [address],    °[place]  1-866-801-8205   °[facility] ° [address], [place], [place] 704-293-8524   °[facility] [address], [place] 336-845-3988 Admissions: 8am-3pm M-F  °[facility] [address]    °[place], [place] 336-841-1104   °[facility] [address], [place], [place] 336-379-7146   °[facility] [address]  °[place], [place] 336-285-9073   °[facility] - Intensive Outpatient [address], [place], [place] 336-852-3033   °[facility] [address]  °[place], [place] 1-877-615-2722 or 336-784-9470   °[facility] [address] [place], [place] 336-227-7417 Accepts Medicaid  °[facility] [address]  ° [place] 1-800-659-3381 Substance Abuse/Addiction Treatment  ° °[place] Behavioral Health Resources °Organization         Address  Phone  Notes  °[facility]  (888) 581-9988   °Julie Brannon, PhD [address] [place], [place]   (336) 349-5553 or (336) 951-0000   °[facility]   [address] °[place], [place] (336) 349-4454   °[facility] [address], [place], [place] (336) 342-8316 Insurance/Medicaid/sponsorship through [facility]  °[facility] [address]                                    [place], [place] (336) 342-8316 Therapy/tele-psych/case  °[facility] [address]  ° [place], [place] (336) 349-2233    °Dr. Arfeen  (336) 349-4544   °[facility] [place]  [employer] [place] [facility] 1) [address], [place] °2) [address], [place] °3)  [address], [place] (336) 349-3220 °(336) 342-7768 ° °(336) 342-8140   °[place] [facility] (336) 342-1394 or (336) 342-3537 (After Hours)    ° ° ° °

## 2015-04-16 NOTE — ED Notes (Signed)
Patient states that she has had a toothache to her mouth x 1 week.

## 2015-06-25 ENCOUNTER — Encounter (HOSPITAL_COMMUNITY): Payer: Self-pay

## 2015-06-25 ENCOUNTER — Inpatient Hospital Stay (HOSPITAL_COMMUNITY)
Admission: AD | Admit: 2015-06-25 | Discharge: 2015-06-25 | Disposition: A | Discharge status: Home / Self Care | Payer: Medicaid Other | Source: Ambulatory Visit | Attending: Obstetrics & Gynecology | Admitting: Obstetrics & Gynecology

## 2015-06-25 DIAGNOSIS — R109 Unspecified abdominal pain: Secondary | ICD-10-CM | POA: Diagnosis present

## 2015-06-25 DIAGNOSIS — R1031 Right lower quadrant pain: Secondary | ICD-10-CM

## 2015-06-25 DIAGNOSIS — Z87891 Personal history of nicotine dependence: Secondary | ICD-10-CM | POA: Insufficient documentation

## 2015-06-25 LAB — URINALYSIS, ROUTINE W REFLEX MICROSCOPIC
BILIRUBIN URINE: NEGATIVE
Glucose, UA: NEGATIVE mg/dL
Ketones, ur: NEGATIVE mg/dL
Leukocytes, UA: NEGATIVE
NITRITE: NEGATIVE
PROTEIN: 100 mg/dL — AB
pH: 5.5 (ref 5.0–8.0)

## 2015-06-25 LAB — CBC
HCT: 37.8 % (ref 36.0–46.0)
HEMOGLOBIN: 12.7 g/dL (ref 12.0–15.0)
MCH: 30 pg (ref 26.0–34.0)
MCHC: 33.6 g/dL (ref 30.0–36.0)
MCV: 89.2 fL (ref 78.0–100.0)
Platelets: 196 10*3/uL (ref 150–400)
RBC: 4.24 MIL/uL (ref 3.87–5.11)
RDW: 13.2 % (ref 11.5–15.5)
WBC: 8.4 10*3/uL (ref 4.0–10.5)

## 2015-06-25 LAB — COMPREHENSIVE METABOLIC PANEL
ALK PHOS: 93 U/L (ref 38–126)
ALT: 19 U/L (ref 14–54)
AST: 19 U/L (ref 15–41)
Albumin: 4 g/dL (ref 3.5–5.0)
Anion gap: 6 (ref 5–15)
BUN: 10 mg/dL (ref 6–20)
CALCIUM: 8.9 mg/dL (ref 8.9–10.3)
CO2: 25 mmol/L (ref 22–32)
CREATININE: 0.89 mg/dL (ref 0.44–1.00)
Chloride: 108 mmol/L (ref 101–111)
GFR calc Af Amer: >60 mL/min (ref >60)
Glucose, Bld: 103 mg/dL — ABNORMAL HIGH (ref 65–99)
Potassium: 3.8 mmol/L (ref 3.5–5.1)
SODIUM: 139 mmol/L (ref 135–145)
Total Bilirubin: 0.6 mg/dL (ref 0.3–1.2)
Total Protein: 7.6 g/dL (ref 6.5–8.1)

## 2015-06-25 LAB — WET PREP, GENITAL
Clue Cells Wet Prep HPF POC: NONE SEEN
Sperm: NONE SEEN
Trich, Wet Prep: NONE SEEN
Yeast Wet Prep HPF POC: NONE SEEN

## 2015-06-25 LAB — URINE MICROSCOPIC-ADD ON

## 2015-06-25 LAB — POCT PREGNANCY, URINE: PREG TEST UR: NEGATIVE

## 2015-06-25 MED ORDER — KETOROLAC TROMETHAMINE 60 MG/2ML IM SOLN
60.0000 mg | Freq: Once | INTRAMUSCULAR | Status: AC
  Administered 2015-06-25: 60 mg via INTRAMUSCULAR
  Filled 2015-06-25: qty 2

## 2015-06-25 MED ORDER — IBUPROFEN 800 MG PO TABS: 800.0000 mg | ORAL_TABLET | Freq: Three times a day (TID) | ORAL | Status: DC

## 2015-06-25 NOTE — MAU Note (Signed)
Pt C/O RLQ pain for the last 3 hours, comes & goes, sharp.  Has been having back & hip pain for the last week.  Denies bleeding or discharge.

## 2015-06-25 NOTE — MAU Provider Note (Signed)
History     CSN: [id number]  Arrival date and time: 06/25/15 1330   First Provider Initiated Contact with Patient 06/25/15 1402      Chief Complaint  Patient presents with  . Abdominal Pain  pt is not pregnant [id number] who presents with RLQ pain.  Pt denies vaginal discharge. Pt has RCM however, is a little unclear of LMP but gives hx of 05/29/2015. Pt is not using anything for contraception but does not desire pregnancy at this time. Pt denies nausea or vomiting , chills, fever or periumblical pain or pain with urination Pt [facility] with last pregnancy 2 years ago Abdominal Pain This is a new problem. The current episode started today. The onset quality is gradual. The problem occurs intermittently. The problem has been waxing and waning. The pain is located in the RLQ. The pain is at a severity of 7/10. The pain is moderate. The quality of the pain is aching. Pertinent negatives include no anorexia, constipation, dysuria, fever, flatus, headaches, nausea or vomiting. Nothing aggravates the pain. She has tried nothing for the symptoms. Her past medical history is significant for gallstones.     Past Medical History  Diagnosis Date  . Smoker   . H/O varicella   . HPV (human papilloma virus) anogenital infection   . Panic attack   . Abnormal Pap smear     Past Surgical History  Procedure Laterality Date  . Mandible surgery    . Cryotherapy  2008 cervix    Family History  Problem Relation Age of Onset  . Other Neg Hx   . COPD Maternal Grandmother   . Diabetes Paternal Grandmother   . Heart disease Father   . COPD Paternal Grandfather     Social History  Substance Use Topics  . Smoking status: Former Smoker    Quit date: 12/11/2010  . Smokeless tobacco: Never Used  . Alcohol Use: No    Allergies:  Allergies  Allergen Reactions  . Penicillins Other (See Comments)    Childhood reaction, unknown    Prescriptions prior to admission  Medication Sig  Dispense Refill Last Dose  . amoxicillin (AMOXIL) 500 MG capsule Take 1 capsule (500 mg total) by mouth 3 (three) times daily. (Patient not taking: Reported on 06/25/2015) 21 capsule 0   . cephALEXin (KEFLEX) 500 MG capsule Take 1 capsule (500 mg total) by mouth 4 (four) times daily. (Patient not taking: Reported on 06/25/2015) 20 capsule 0   . ibuprofen (ADVIL,MOTRIN) 800 MG tablet Take 1 tablet (800 mg total) by mouth 3 (three) times daily. (Patient not taking: Reported on 06/25/2015) 21 tablet 0   . ondansetron (ZOFRAN ODT) 8 MG disintegrating tablet Take 1 tablet (8 mg total) by mouth every 8 (eight) hours as needed for nausea or vomiting. (Patient not taking: Reported on 06/25/2015) 20 tablet 0     Review of Systems  Constitutional: Negative for fever.  Gastrointestinal: Positive for abdominal pain. Negative for nausea, vomiting, constipation, anorexia and flatus.       Soft stool a couple of hours ago  Genitourinary: Negative for dysuria.  Musculoskeletal: Positive for back pain.  Neurological: Negative for dizziness and headaches.   Physical Exam   Blood pressure 126/75, pulse 73, temperature 97.9 F (36.6 C), temperature source Oral, resp. rate 18, last menstrual period 05/29/2015, not currently breastfeeding.  Physical Exam  Nursing note and vitals reviewed. Constitutional: She is oriented to person, place, and time. She appears well-developed and well-nourished. No  distress.  HENT:  Head: Normocephalic.  Eyes: Pupils are equal, round, and reactive to light.  Neck: Normal range of motion. Neck supple.  Cardiovascular: Normal rate.   Respiratory: Effort normal.  GI: Soft. She exhibits no distension. There is tenderness. There is no rebound and no guarding.  No CVA tenderness  Genitourinary:  Small amount of white vaginal discharge in vault; cervix clean, NT; uterus NSSC NT- no CMT; right adnexa mildly tender without appreciable enlargement- no rebound; left adnexa without palpable  enlargement  Musculoskeletal: Normal range of motion.  Neurological: She is alert and oriented to person, place, and time.  Skin: Skin is warm and dry.  Psychiatric: She has a normal mood and affect.    MAU Course  Procedures Results for orders placed or performed during the hospital encounter of 06/25/15 (from the past 24 hour(s))  Urinalysis, Routine w reflex microscopic (not at [facility])     Status: Abnormal   Collection Time: 06/25/15  1:42 PM  Result Value Ref Range   Color, Urine BROWN (A) YELLOW   APPearance CLOUDY (A) CLEAR   Specific Gravity, Urine >1.030 (H) 1.005 - 1.030   pH 5.5 5.0 - 8.0   Glucose, UA NEGATIVE NEGATIVE mg/dL   Hgb urine dipstick LARGE (A) NEGATIVE   Bilirubin Urine NEGATIVE NEGATIVE   Ketones, ur NEGATIVE NEGATIVE mg/dL   Protein, ur 562 (A) NEGATIVE mg/dL   Nitrite NEGATIVE NEGATIVE   Leukocytes, UA NEGATIVE NEGATIVE  Urine microscopic-add on     Status: Abnormal   Collection Time: 06/25/15  1:42 PM  Result Value Ref Range   Squamous Epithelial / LPF 0-5 (A) NONE SEEN   WBC, UA 0-5 0 - 5 WBC/hpf   RBC / HPF TOO NUMEROUS TO COUNT 0 - 5 RBC/hpf   Bacteria, UA FEW (A) NONE SEEN  Pregnancy, urine POC     Status: None   Collection Time: 06/25/15  1:48 PM  Result Value Ref Range   Preg Test, Ur NEGATIVE NEGATIVE  CBC     Status: None   Collection Time: 06/25/15  2:27 PM  Result Value Ref Range   WBC 8.4 4.0 - 10.5 K/uL   RBC 4.24 3.87 - 5.11 MIL/uL   Hemoglobin 12.7 12.0 - 15.0 g/dL   HCT 13.0 86.5 - 78.4 %   MCV 89.2 78.0 - 100.0 fL   MCH 30.0 26.0 - 34.0 pg   MCHC 33.6 30.0 - 36.0 g/dL   RDW 69.6 29.5 - 28.4 %   Platelets 196 150 - 400 K/uL  Comprehensive metabolic panel     Status: Abnormal   Collection Time: 06/25/15  2:27 PM  Result Value Ref Range   Sodium 139 135 - 145 mmol/L   Potassium 3.8 3.5 - 5.1 mmol/L   Chloride 108 101 - 111 mmol/L   CO2 25 22 - 32 mmol/L   Glucose, Bld 103 (H) 65 - 99 mg/dL   BUN 10 6 - 20 mg/dL    Creatinine, Ser 1.32 0.44 - 1.00 mg/dL   Calcium 8.9 8.9 - 44.0 mg/dL   Total Protein 7.6 6.5 - 8.1 g/dL   Albumin 4.0 3.5 - 5.0 g/dL   AST 19 15 - 41 U/L   ALT 19 14 - 54 U/L   Alkaline Phosphatase 93 38 - 126 U/L   Total Bilirubin 0.6 0.3 - 1.2 mg/dL   GFR calc non Af Amer >60 >60 mL/min   GFR calc Af Amer >60 >60 mL/min  Anion gap 6 5 - 15  Wet prep, genital     Status: Abnormal   Collection Time: 06/25/15  2:45 PM  Result Value Ref Range   Yeast Wet Prep HPF POC NONE SEEN NONE SEEN   Trich, Wet Prep NONE SEEN NONE SEEN   Clue Cells Wet Prep HPF POC NONE SEEN NONE SEEN   WBC, Wet Prep HPF POC FEW (A) NONE SEEN   Sperm NONE SEEN   GC/chlamydia pending Urine culture pending Toradol  IM given for pain Assessment and Plan  RLQ abdominal pain Urine culture pending F/u with [facility]- Dr. Tenny Craw Contraception options discussed F/u for increase in pain, fever, chills or N/V  Santhiago [place] 06/25/2015, 2:03 PM

## 2015-06-25 NOTE — Discharge Instructions (Signed)

## 2015-06-26 LAB — URINE CULTURE: Special Requests: NORMAL

## 2015-06-26 LAB — GC/CHLAMYDIA PROBE AMP (~~LOC~~) NOT AT ARMC
Chlamydia: NEGATIVE
Neisseria Gonorrhea: NEGATIVE

## 2015-08-21 ENCOUNTER — Encounter (HOSPITAL_BASED_OUTPATIENT_CLINIC_OR_DEPARTMENT_OTHER): Payer: Self-pay | Admitting: *Deleted

## 2015-08-21 ENCOUNTER — Emergency Department (HOSPITAL_BASED_OUTPATIENT_CLINIC_OR_DEPARTMENT_OTHER)
Admission: EM | Admit: 2015-08-21 | Discharge: 2015-08-21 | Disposition: A | Discharge status: Home / Self Care | Payer: Medicaid Other | Attending: Emergency Medicine | Admitting: Emergency Medicine

## 2015-08-21 DIAGNOSIS — Z791 Long term (current) use of non-steroidal anti-inflammatories (NSAID): Secondary | ICD-10-CM | POA: Insufficient documentation

## 2015-08-21 DIAGNOSIS — Z8659 Personal history of other mental and behavioral disorders: Secondary | ICD-10-CM | POA: Diagnosis not present

## 2015-08-21 DIAGNOSIS — Z8619 Personal history of other infectious and parasitic diseases: Secondary | ICD-10-CM | POA: Diagnosis not present

## 2015-08-21 DIAGNOSIS — L282 Other prurigo: Secondary | ICD-10-CM

## 2015-08-21 DIAGNOSIS — L299 Pruritus, unspecified: Secondary | ICD-10-CM | POA: Insufficient documentation

## 2015-08-21 DIAGNOSIS — Z880 Allergy status to penicillin: Secondary | ICD-10-CM | POA: Diagnosis not present

## 2015-08-21 DIAGNOSIS — Z87891 Personal history of nicotine dependence: Secondary | ICD-10-CM | POA: Diagnosis not present

## 2015-08-21 DIAGNOSIS — R21 Rash and other nonspecific skin eruption: Secondary | ICD-10-CM | POA: Insufficient documentation

## 2015-08-21 MED ORDER — PREDNISONE 20 MG PO TABS: ORAL_TABLET | ORAL | Status: DC

## 2015-08-21 MED ORDER — HYDROXYZINE HCL 25 MG PO TABS: 25.0000 mg | ORAL_TABLET | Freq: Four times a day (QID) | ORAL | Status: DC

## 2015-08-21 NOTE — ED Provider Notes (Signed)
CSN: [id number]     Arrival date & time 08/21/15  1721 History   First MD Initiated Contact with Patient 08/21/15 1750     Chief Complaint  Patient presents with  . Rash     (Consider location/radiation/quality/duration/timing/severity/associated sxs/prior Treatment) Patient is a 30 y.o. female presenting with rash. The history is provided by the patient. No language interpreter was used.  Rash Location:  Torso and hand Hand rash location:  R finger and L finger Torso rash location:  Abd LLQ and abd RLQ Quality: itchiness and redness   Severity:  Moderate Progression:  Waxing and waning Chronicity:  New Context comment:  Family recently treated for scabies Ineffective treatments:  Antihistamines   Past Medical History  Diagnosis Date  . Smoker   . H/O varicella   . HPV (human papilloma virus) anogenital infection   . Panic attack   . Abnormal Pap smear    Past Surgical History  Procedure Laterality Date  . Mandible surgery    . Cryotherapy  2008 cervix   Family History  Problem Relation Age of Onset  . Other Neg Hx   . COPD Maternal Grandmother   . Diabetes Paternal Grandmother   . Heart disease Father   . COPD Paternal Grandfather    Social History  Substance Use Topics  . Smoking status: Former Smoker    Quit date: 12/11/2010  . Smokeless tobacco: Never Used  . Alcohol Use: No   OB History    Gravida Para Term Preterm AB TAB SAB Ectopic Multiple Living   4 4 3 1      4      Review of Systems  Skin: Positive for rash.  All other systems reviewed and are negative.     Allergies  Penicillins  Home Medications   Prior to Admission medications   Medication Sig Start Date End Date Taking? Authorizing Provider  ibuprofen (ADVIL,MOTRIN) 800 MG tablet Take 1 tablet (800 mg total) by mouth 3 (three) times daily. Patient not taking: Reported on 06/25/2015 04/16/15   Eber HongBrian Miller, MD  ibuprofen (ADVIL,MOTRIN) 800 MG tablet Take 1 tablet (800 mg total) by  mouth 3 (three) times daily. 06/25/15   Jean RosenthalSusan P Lineberry, NP   BP 113/71 mmHg  Pulse 71  Temp(Src) 98.2 F (36.8 C) (Oral)  Resp 20  Ht 5\' 1"  (1.549 m)  Wt 81.647 kg  BMI 34.03 kg/m2  SpO2 100%  LMP 08/14/2015 Physical Exam  Constitutional: She is oriented to person, place, and time. She appears well-developed and well-nourished.  HENT:  Head: Normocephalic.  Eyes: Pupils are equal, round, and reactive to light.  Neck: Neck supple.  Cardiovascular: Normal rate and regular rhythm.   Pulmonary/Chest: Effort normal and breath sounds normal.  Abdominal: Soft. Bowel sounds are normal.  Musculoskeletal: She exhibits no edema or tenderness.  Lymphadenopathy:    She has no cervical adenopathy.  Neurological: She is alert and oriented to person, place, and time.  Skin: Skin is warm and dry. Rash noted. There is erythema.  Psychiatric: She has a normal mood and affect.  Nursing note and vitals reviewed.   ED Course  Procedures (including critical care time) Labs Review Labs Reviewed - No data to display  Imaging Review No results found. I have personally reviewed and evaluated these images and lab results as part of my medical decision-making.   EKG Interpretation None      MDM   Final diagnoses:  None  Patient with nonspecific eruption. No  signs of infection. Discharge with symptomatic treatment. Follow up with PCP in 2-3 days     Felicie Morn, NP 08/22/15 1610  Linwood Dibbles, MD 08/23/15 [phone]

## 2015-08-21 NOTE — Discharge Instructions (Signed)
Pruritus °Pruritus is an itching feeling. There are many different conditions and factors that can make your skin itchy. Dry skin is one of the most common causes of itching. Most cases of itching do not require medical attention. Itchy skin can turn into a rash.  °HOME CARE INSTRUCTIONS  °Watch your pruritus for any changes. Take these steps to help with your condition:  °Skin Care °· Moisturize your skin as needed. A moisturizer that contains petroleum jelly is best for keeping moisture in your skin. °· Take or apply medicines only as directed by your health care provider. This may include: °¨ Corticosteroid cream. °¨ Anti-itch lotions. °¨ Oral anti-histamines. °· Apply cool compresses to the affected areas. °· Try taking a bath with: °¨ Epsom salts. Follow the instructions on the packaging. You can get these at your local pharmacy or grocery store. °¨ Baking soda. Pour a small amount into the bath as directed by your health care provider. °¨ Colloidal oatmeal. Follow the instructions on the packaging. You can get this at your local pharmacy or grocery store. °· Try applying baking soda paste to your skin. Stir water into baking soda until it reaches a paste-like consistency.   °· Do not scratch your skin. °· Avoid hot showers or baths, which can make itching worse. A cold shower may help with itching as long as you use a moisturizer after. °· Avoid scented soaps, detergents, and perfumes. Use gentle soaps, detergents, perfumes, and other cosmetic products. °General Instructions °· Avoid wearing tight clothes. °· Keep a journal to help track what causes your itch. Write down: °¨ What you eat. °¨ What cosmetic products you use. °¨ What you drink. °¨ What you wear. This includes jewelry. °· Use a humidifier. This keeps the air moist, which helps to prevent dry skin. °SEEK MEDICAL CARE IF: °· The itching does not go away after several days. °· You sweat at night. °· You have weight loss. °· You are unusually  thirsty. °· You urinate more than normal. °· You are more tired than normal. °· You have abdominal pain. °· Your skin tingles. °· You feel weak. °· Your skin or the whites of your eyes look yellow (jaundice). °· Your skin feels numb. °  °This information is not intended to replace advice given to you by your health care provider. Make sure you discuss any questions you have with your health care provider. °  °Document Released: 01/09/2011 Document Revised: 09/13/2014 Document Reviewed: 04/25/2014 °Elsevier Interactive Patient Education ©2016 [employer] ° °

## 2015-08-21 NOTE — ED Notes (Signed)
Rash. She was started on meds for scabies yesterday. Her family has the same rash and got better after using the cream. She has been treated for scabies. Continues to itch.

## 2015-08-29 ENCOUNTER — Encounter (HOSPITAL_BASED_OUTPATIENT_CLINIC_OR_DEPARTMENT_OTHER): Payer: Self-pay | Admitting: Emergency Medicine

## 2015-08-29 ENCOUNTER — Emergency Department (HOSPITAL_BASED_OUTPATIENT_CLINIC_OR_DEPARTMENT_OTHER)
Admission: EM | Admit: 2015-08-29 | Discharge: 2015-08-29 | Disposition: A | Discharge status: Home / Self Care | Payer: Medicaid Other | Attending: Emergency Medicine | Admitting: Emergency Medicine

## 2015-08-29 DIAGNOSIS — Z880 Allergy status to penicillin: Secondary | ICD-10-CM | POA: Insufficient documentation

## 2015-08-29 DIAGNOSIS — R21 Rash and other nonspecific skin eruption: Secondary | ICD-10-CM | POA: Diagnosis present

## 2015-08-29 DIAGNOSIS — Z8659 Personal history of other mental and behavioral disorders: Secondary | ICD-10-CM | POA: Insufficient documentation

## 2015-08-29 DIAGNOSIS — Z87891 Personal history of nicotine dependence: Secondary | ICD-10-CM | POA: Insufficient documentation

## 2015-08-29 DIAGNOSIS — B86 Scabies: Secondary | ICD-10-CM | POA: Insufficient documentation

## 2015-08-29 DIAGNOSIS — Z791 Long term (current) use of non-steroidal anti-inflammatories (NSAID): Secondary | ICD-10-CM | POA: Diagnosis not present

## 2015-08-29 MED ORDER — PERMETHRIN 5 % EX CREA: TOPICAL_CREAM | CUTANEOUS | Status: DC

## 2015-08-29 MED ORDER — HYDROXYZINE HCL 25 MG PO TABS: 25.0000 mg | ORAL_TABLET | Freq: Four times a day (QID) | ORAL | Status: DC

## 2015-08-29 NOTE — ED Provider Notes (Signed)
CSN: [id number]     Arrival date & time 08/29/15  1518 History   First MD Initiated Contact with Patient 08/29/15 1532     Chief Complaint  Patient presents with  . Rash   HPI  Nancy Palmer is a 30 year old female presenting with a rash. Onset of symptoms was this morning. She complains of small red bumps over her arms, torso, at her waistline and over her calves. The rash is extremely pruritic. She has tried Benadryl at home without relief. She notes that her family was recently treated for scabies. She was treated approximately one week ago. She states that she slept in the same bed as her partner for the first time in a week and woke this morning with the rash. She is concerned that he was not fully treated and she has gotten the scabies from him. She states this feels and looks the same as her last scabies. The itching is significantly worse at night. She denies sensation of throat closing or itching, facial swelling or difficulty breathing. She has no other complaints today.  Past Medical History  Diagnosis Date  . Smoker   . H/O varicella   . HPV (human papilloma virus) anogenital infection   . Panic attack   . Abnormal Pap smear    Past Surgical History  Procedure Laterality Date  . Mandible surgery    . Cryotherapy  2008 cervix   Family History  Problem Relation Age of Onset  . Other Neg Hx   . COPD Maternal Grandmother   . Diabetes Paternal Grandmother   . Heart disease Father   . COPD Paternal Grandfather    Social History  Substance Use Topics  . Smoking status: Former Smoker    Quit date: 12/11/2010  . Smokeless tobacco: Never Used  . Alcohol Use: No   OB History    Gravida Para Term Preterm AB TAB SAB Ectopic Multiple Living   Review of Systems  All other systems reviewed and are negative.     Allergies  Penicillins  Home Medications   Prior to Admission medications   Medication Sig Start Date End Date Taking? Authorizing Provider   hydrOXYzine (ATARAX/VISTARIL) 25 MG tablet Take 1 tablet (25 mg total) by mouth every 6 (six) hours. 08/21/15   Felicie Morn, NP  hydrOXYzine (ATARAX/VISTARIL) 25 MG tablet Take 1 tablet (25 mg total) by mouth every 6 (six) hours. 08/29/15   Dea Bitting, PA-C  ibuprofen (ADVIL,MOTRIN) 800 MG tablet Take 1 tablet (800 mg total) by mouth 3 (three) times daily. Patient not taking: Reported on 06/25/2015 04/16/15   Eber Hong, MD  ibuprofen (ADVIL,MOTRIN) 800 MG tablet Take 1 tablet (800 mg total) by mouth 3 (three) times daily. 06/25/15   Jean Rosenthal, NP  permethrin (ELIMITE) 5 % cream Apply to affected area once 08/29/15   Daxter Paule, PA-C  predniSONE (DELTASONE) 20 MG tablet 3 tabs po day one, then 2 tabs daily x 4 days 08/21/15   Felicie Morn, NP   BP 133/73 mmHg  Pulse 85  Temp(Src) 98.6 F (37 C) (Oral)  Resp 18  Ht  (1.549 m)  Wt 81.647 kg  BMI 34.03 kg/m2  SpO2 100%  LMP 08/14/2015 Physical Exam  Constitutional: She appears well-developed and well-nourished. No distress.  HENT:  Head: Normocephalic and atraumatic.  Right Ear: External ear normal.  Left Ear: External ear normal.  Eyes: Conjunctivae  are normal. Right eye exhibits no discharge. Left eye exhibits no discharge. No scleral icterus.  Neck: Normal range of motion.  Cardiovascular: Normal rate.   Pulmonary/Chest: Effort normal.  Musculoskeletal: Normal range of motion.  Moves all extremities spontaneously  Neurological: She is alert. Coordination normal.  Skin: Skin is warm and dry.  Multiple erythematous papules noted over the extremities and trunk. Papules are most concentrated over her wrists and at the waistline. No vesicles or pustules. No warmth or streaking. No sign of superficial skin infection.  Psychiatric: She has a normal mood and affect. Her behavior is normal.  Nursing note and vitals reviewed.   ED Course  Procedures (including critical care time) Labs Review Labs Reviewed - No data to  display  Imaging Review No results found. I have personally reviewed and evaluated these images and lab results as part of my medical decision-making.   EKG Interpretation None      MDM   Final diagnoses:  Scabies   Pt presenting with rash consistent with scabies; likely from family members. Instructed to apply permethrin cream from head to toe and leave on for 8-12 hours and to repeat treatment if new eruptions occur. Also discussed cleaning the entire household including sheets and clothes and using RID spray in car and on sofa. Pt is to follow up with PCP in 3-4 days. Return precautions given in discharge paperwork and discussed with pt at bedside. Pt stable for discharge.     Rolm GalaStevi Charlot Gouin, PA-C 08/29/15 1601  Vanetta MuldersScott Zackowski, MD 08/31/15 1649

## 2015-08-29 NOTE — ED Notes (Signed)
Patient states that she feels like she had scabies again.

## 2015-08-29 NOTE — Discharge Instructions (Signed)

## 2015-08-30 ENCOUNTER — Encounter (HOSPITAL_BASED_OUTPATIENT_CLINIC_OR_DEPARTMENT_OTHER): Payer: Self-pay

## 2015-08-30 ENCOUNTER — Emergency Department (HOSPITAL_BASED_OUTPATIENT_CLINIC_OR_DEPARTMENT_OTHER)
Admission: EM | Admit: 2015-08-30 | Discharge: 2015-08-30 | Disposition: A | Discharge status: Home / Self Care | Payer: Medicaid Other | Attending: Emergency Medicine | Admitting: Emergency Medicine

## 2015-08-30 DIAGNOSIS — Z8659 Personal history of other mental and behavioral disorders: Secondary | ICD-10-CM | POA: Insufficient documentation

## 2015-08-30 DIAGNOSIS — Z87891 Personal history of nicotine dependence: Secondary | ICD-10-CM | POA: Insufficient documentation

## 2015-08-30 DIAGNOSIS — L509 Urticaria, unspecified: Secondary | ICD-10-CM | POA: Diagnosis not present

## 2015-08-30 DIAGNOSIS — Z880 Allergy status to penicillin: Secondary | ICD-10-CM | POA: Diagnosis not present

## 2015-08-30 DIAGNOSIS — Z8619 Personal history of other infectious and parasitic diseases: Secondary | ICD-10-CM | POA: Insufficient documentation

## 2015-08-30 DIAGNOSIS — R21 Rash and other nonspecific skin eruption: Secondary | ICD-10-CM | POA: Diagnosis present

## 2015-08-30 MED ORDER — PREDNISONE 50 MG PO TABS
60.0000 mg | ORAL_TABLET | Freq: Once | ORAL | Status: AC
  Administered 2015-08-30: 60 mg via ORAL
  Filled 2015-08-30: qty 1

## 2015-08-30 MED ORDER — DIPHENHYDRAMINE HCL 25 MG PO TABS: 25.0000 mg | ORAL_TABLET | ORAL | Status: DC | PRN

## 2015-08-30 MED ORDER — PREDNISONE 20 MG PO TABS: 40.0000 mg | ORAL_TABLET | Freq: Every day | ORAL | Status: DC

## 2015-08-30 MED ORDER — DIPHENHYDRAMINE HCL 50 MG/ML IJ SOLN
25.0000 mg | Freq: Once | INTRAMUSCULAR | Status: AC
  Administered 2015-08-30: 25 mg via INTRAMUSCULAR
  Filled 2015-08-30: qty 1

## 2015-08-30 MED FILL — DIPHENHYDRAMINE 25 MG CAP: 25 | 17 days supply | Qty: 100 | Fill #0

## 2015-08-30 MED FILL — predniSONE 20 MG TABS: 20 | 10 days supply | Qty: 10 | Fill #0

## 2015-08-30 NOTE — ED Notes (Addendum)
Hives and rash x 1 month-seen here twice for same-dx with scabies yesterday-states is worse-did not fill rx x 2 yesterday but did use her children's rx cream for scabies

## 2015-08-30 NOTE — ED Provider Notes (Signed)
CSN: [id number]     Arrival date & time 08/30/15  1159 History   First MD Initiated Contact with Patient 08/30/15 1304     Chief Complaint  Patient presents with  . Rash     (Consider location/radiation/quality/duration/timing/severity/associated sxs/prior Treatment) HPI   Patient is a 30 year old female who presents the ED with complaint of rash. Patient reports having a worsening, red itchy rash to her face, neck and abdomen that started this morning. She notes she has been seen in the ED 2 times over the past few weeks for a different red itchy rash. She notes she was diagnosed with scabies and sent home with permethrin and vistaril. Patient reports she has been using the permethrin cream at night and notes in the morning she has had raised red itchy rash, consistent with hives, to her face, neck and abdomen which improves throughout the day after taking benadryl. Patient reports her scabies rash has improved since her last ED visit. She states she has cleaned all of her entire household including linens and clothes. Patient denies taking any medications prior to arrival. Denies fever, chills, body aches, sensation of throat closing or itching, facial/neck swelling, difficulty breathing, wheezing, chest pain, abdominal pain, nausea, vomiting, numbness, 21, weakness. She reports her family members have also had scabies rash and then treated for but denies anyone having similar hives/rash which she presents with to the ED this morning.  Past Medical History  Diagnosis Date  . Smoker   . H/O varicella   . HPV (human papilloma virus) anogenital infection   . Panic attack   . Abnormal Pap smear    Past Surgical History  Procedure Laterality Date  . Mandible surgery    . Cryotherapy  2008 cervix   Family History  Problem Relation Age of Onset  . Other Neg Hx   . COPD Maternal Grandmother   . Diabetes Paternal Grandmother   . Heart disease Father   . COPD Paternal Grandfather    Social  History  Substance Use Topics  . Smoking status: Former Smoker    Quit date: 12/11/2010  . Smokeless tobacco: Never Used  . Alcohol Use: No   OB History    Gravida Para Term Preterm AB TAB SAB Ectopic Multiple Living   4 4 3 1      4      Review of Systems  Skin: Positive for rash.  All other systems reviewed and are negative.     Allergies  Penicillins  Home Medications   Prior to Admission medications   Medication Sig Start Date End Date Taking? Authorizing Provider  diphenhydrAMINE (BENADRYL) 25 MG tablet Take 1 tablet (25 mg total) by mouth every 4 (four) hours as needed for itching. 08/30/15   Barrett HenleNicole Elizabeth Nadeau, PA-C  predniSONE (DELTASONE) 20 MG tablet Take 2 tablets (40 mg total) by mouth daily. 08/30/15   Satira SarkNicole Elizabeth Nadeau, PA-C   BP 123/82 mmHg  Pulse 82  Temp(Src) 98.4 F (36.9 C) (Oral)  Resp 16  Ht 5\' 1"  (1.549 m)  Wt 81.647 kg  BMI 34.03 kg/m2  SpO2 100%  LMP 08/14/2015 Physical Exam  Constitutional: She is oriented to person, place, and time. She appears well-developed and well-nourished.  HENT:  Head: Normocephalic and atraumatic.  Mouth/Throat: Uvula is midline, oropharynx is clear and moist and mucous membranes are normal. No oral lesions. No oropharyngeal exudate, posterior oropharyngeal edema, posterior oropharyngeal erythema or tonsillar abscesses.  Eyes: Conjunctivae and EOM are normal. Right eye  exhibits no discharge. Left eye exhibits no discharge. No scleral icterus.  Neck: Normal range of motion. Neck supple.  Cardiovascular: Normal rate.   Pulmonary/Chest: Effort normal. No stridor. No respiratory distress.  Abdominal: Soft. She exhibits no distension.  Musculoskeletal: Normal range of motion. She exhibits no edema.  Lymphadenopathy:    She has no cervical adenopathy.  Neurological: She is alert and oriented to person, place, and time.  Skin: Skin is warm and dry. Rash noted.  Few small healing small erythematous excoriated  papules noted over extremities and trunk, most present over wrists and waistline. Blanching raised erythematous macular papular lesions noted to abdomen and neck. Nonraised blanching erythematous macular papular lesions noted to the facial cheeks. No vesicles, pustules, burrows. No lesions on the palms or soles. No warmth or streaking. No sign of skin infection.  Nursing note and vitals reviewed.   ED Course  Procedures (including critical care time) Labs Review Labs Reviewed - No data to display  Imaging Review No results found. I have personally reviewed and evaluated these images and lab results as part of my medical decision-making.   EKG Interpretation None      MDM   Final diagnoses:  Urticaria    Rash consistent with urticaria. Patient denies any difficulty breathing or swallowing.  Pt reports she was initially seen in the ED on 4/10 for a different itching rash and d/c with scabies. Pt reports taking vistaril and using permethrin as prescribed but notes she has been getting hives when she wakes up in the morning after applying permethrin at night. Pt has a patent airway without stridor and is handling secretions without difficulty; no angioedema. No blisters, no pustules, no warmth, no draining sinus tracts, no superficial abscesses, no bullous impetigo, no vesicles, no desquamation, no target lesions with dusky purpura or a central bulla. Not tender to touch. No concern for superimposed infection. No concern for SJS, TEN, TSS, tick borne illness, syphilis or other life-threatening condition. Will discharge home with short course of steroids, recommend Benadryl and advise pt to refrain from using permethrin cream. Patient given resources to follow with PCP. Advised patient to follow up in 1-2 days if her rash has not improved.      Satira Sark [place], [place] 08/30/15 1510  Pricilla Loveless, MD 08/30/15 [phone]

## 2015-08-30 NOTE — Discharge Instructions (Signed)
Take your medications as prescribed. I recommend refraining from using your permethrin lotion due to it appearing to cause an allergic reaction. Continue to wash your remaining household and care to remove of the suspected scabies. You may continue taking her prescription of Vistaril as prescribed as needed for itching. Please follow up with a primary care provider from the Resource Guide provided below in 1 week as needed. Please return to the Emergency Department if symptoms worsen or new onset of fever, worsening rash, difficulty breathing, wheezing, facial/neck swelling, throat itching, swollen lips, chest pain/tightness.

## 2015-09-02 ENCOUNTER — Emergency Department (HOSPITAL_BASED_OUTPATIENT_CLINIC_OR_DEPARTMENT_OTHER)
Admission: EM | Admit: 2015-09-02 | Discharge: 2015-09-02 | Disposition: A | Discharge status: Home / Self Care | Payer: Medicaid Other | Attending: Emergency Medicine | Admitting: Emergency Medicine

## 2015-09-02 ENCOUNTER — Encounter (HOSPITAL_BASED_OUTPATIENT_CLINIC_OR_DEPARTMENT_OTHER): Payer: Self-pay | Admitting: Emergency Medicine

## 2015-09-02 DIAGNOSIS — Z8619 Personal history of other infectious and parasitic diseases: Secondary | ICD-10-CM | POA: Insufficient documentation

## 2015-09-02 DIAGNOSIS — R21 Rash and other nonspecific skin eruption: Secondary | ICD-10-CM

## 2015-09-02 DIAGNOSIS — Z87891 Personal history of nicotine dependence: Secondary | ICD-10-CM | POA: Diagnosis not present

## 2015-09-02 DIAGNOSIS — L539 Erythematous condition, unspecified: Secondary | ICD-10-CM | POA: Insufficient documentation

## 2015-09-02 DIAGNOSIS — Z7952 Long term (current) use of systemic steroids: Secondary | ICD-10-CM | POA: Diagnosis not present

## 2015-09-02 DIAGNOSIS — Z880 Allergy status to penicillin: Secondary | ICD-10-CM | POA: Diagnosis not present

## 2015-09-02 DIAGNOSIS — Z8659 Personal history of other mental and behavioral disorders: Secondary | ICD-10-CM | POA: Diagnosis not present

## 2015-09-02 LAB — RAPID STREP SCREEN (MED CTR MEBANE ONLY): Streptococcus, Group A Screen (Direct): NEGATIVE

## 2015-09-02 NOTE — ED Provider Notes (Signed)
CSN: [id number]     Arrival date & time 09/02/15  0009 History   First MD Initiated Contact with Patient 09/02/15 [phone]     Chief Complaint  Patient presents with  . Rash     (Consider location/radiation/quality/duration/timing/severity/associated sxs/prior Treatment) HPI Comments: [age]YO F who p/w rash. This is the patient's third presentation for rash in the past 1-2 weeks. She initially presented with a rash which was suspicious for scabies and was treated with permethrin cream. She followed all scabies instructions including washing all bedding and clothes and was compliant with the cream. She presented here again 4 days ago with worsening rash which was pruritic. She was treated for an allergic reaction with prednisone and Benadryl which she has been taking at home. She reports that her rash is mostly improving, however today she noticed a nodule on the bottom of her right foot and palm of her right hand which are tender and not like the rash on the rest of her body. She had a sore throat earlier but it is currently resolved. She denies any fevers, cough/cold symptoms, abdominal pain, vomiting, diarrhea, or mouth sores. No one around her has this rash. She denies any new exposures to soaps or detergents.  Patient is a 30 y.o. female presenting with rash. The history is provided by the patient.  Rash   Past Medical History  Diagnosis Date  . Smoker   . H/O varicella   . HPV (human papilloma virus) anogenital infection   . Panic attack   . Abnormal Pap smear    Past Surgical History  Procedure Laterality Date  . Mandible surgery    . Cryotherapy  2008 cervix   Family History  Problem Relation Age of Onset  . Other Neg Hx   . COPD Maternal Grandmother   . Diabetes Paternal Grandmother   . Heart disease Father   . COPD Paternal Grandfather    Social History  Substance Use Topics  . Smoking status: Former Smoker    Quit date: 12/11/2010  . Smokeless tobacco: Never Used  .  Alcohol Use: No   OB History    Gravida Para Term Preterm AB TAB SAB Ectopic Multiple Living   4 4 3 1      4      Review of Systems  Skin: Positive for rash.   10 Systems reviewed and are negative for acute change except as noted in the HPI.    Allergies  Penicillins  Home Medications   Prior to Admission medications   Medication Sig Start Date End Date Taking? Authorizing Provider  diphenhydrAMINE (BENADRYL) 25 MG tablet Take 1 tablet (25 mg total) by mouth every 4 (four) hours as needed for itching. 08/30/15   Barrett HenleNicole Elizabeth Nadeau, PA-C  predniSONE (DELTASONE) 20 MG tablet Take 2 tablets (40 mg total) by mouth daily. 08/30/15   Satira SarkNicole Elizabeth Nadeau, PA-C   BP 121/74 mmHg  Pulse 81  Temp(Src) 98.4 F (36.9 C) (Oral)  Resp 16  Ht 5\' 1"  (1.549 m)  Wt 180 lb (81.647 kg)  BMI 34.03 kg/m2  SpO2 99%  LMP 08/14/2015 Physical Exam  Constitutional: She is oriented to person, place, and time. She appears well-developed and well-nourished. No distress.  HENT:  Head: Normocephalic and atraumatic.  Mouth/Throat: Oropharynx is clear and moist. No oropharyngeal exudate.  Moist mucous membranes  Eyes: Conjunctivae are normal. Pupils are equal, round, and reactive to light.  Neck: Neck supple.  Cardiovascular: Normal rate, regular rhythm and normal  heart sounds.   No murmur heard. Pulmonary/Chest: Effort normal and breath sounds normal.  Abdominal: Soft. Bowel sounds are normal. She exhibits no distension. There is no tenderness.  Musculoskeletal: She exhibits no edema.  Neurological: She is alert and oriented to person, place, and time.  Fluent speech  Skin: Skin is warm and dry. Rash noted.  Mildly tender, slightly erythematous nodule on middle of plantar surface R foot and thenar eminence R hand; scattered macular rash w/ areas of excoriation and scabbing on posterior neck, waistline, hips, lower back  Psychiatric: She has a normal mood and affect. Judgment normal.  Nursing  note and vitals reviewed.   ED Course  Procedures (including critical care time) Labs Review Labs Reviewed  RAPID STREP SCREEN (NOT AT [facility])  CULTURE, GROUP A STREP [facility])    Imaging Review No results found. I have personally reviewed and evaluated these lab results as part of my medical decision-making.    MDM   Final diagnoses:  Rash and nonspecific skin eruption   Patient presents for reevaluation of a rash. She was initially treated for scabies and one week later treated for allergic reaction, with prednisone and Benadryl which she has been taking. She was well-appearing with normal vital signs. No mucous membrane involvement and no complaints except for itchiness of body rash. She had 2 nodules, on the plantar surface of R foot and palmar R hand. No target lesions. I considered erythema nodosum; she has no shin involvement and the appearance is not c/w this process, however she is already receiving appropriate treatment for it. No tick exposures, fevers, or systemic illness to suggest tick borne process. No mucous membrane involvement to suggest TEN/SJS. I discussed supportive care and instructed to continue prednisone and Benadryl. I extensively reviewed return precautions including worsening symptoms, mucous membrane involvement, fevers, or other systemic symptoms. Patient voiced understanding was discharged in satisfactory condition.   Laurence Spates, MD 09/02/15 [phone]

## 2015-09-02 NOTE — Discharge Instructions (Signed)
FINISH THE COURSE OF PREDNISONE AND YOU CAN CONTINUE TAKING BENADRYL AS DIRECTED FOR 2-3 MORE DAYS.  SEEK IMMEDIATE MEDICAL ATTENTION FOR SORES IN YOUR MOUTH OR GROIN, FEVER, OR SKIN PEELING/SLOUGHING OFF.

## 2015-09-02 NOTE — ED Notes (Signed)
C/o generalized body rash, x 3 days,  sorethroat x 1 day and knot on bottom of rt foot and left shin

## 2015-09-02 NOTE — ED Notes (Signed)
Patient here tonight because the scabies seems to have cleared up but the patient now has a rash and hives to her left neck, her left stomach and the bottom of her feet. The patient reports that she has sore throat.

## 2015-09-05 ENCOUNTER — Encounter (HOSPITAL_BASED_OUTPATIENT_CLINIC_OR_DEPARTMENT_OTHER): Payer: Self-pay | Admitting: Emergency Medicine

## 2015-09-05 DIAGNOSIS — Z880 Allergy status to penicillin: Secondary | ICD-10-CM | POA: Insufficient documentation

## 2015-09-05 DIAGNOSIS — Z8619 Personal history of other infectious and parasitic diseases: Secondary | ICD-10-CM | POA: Insufficient documentation

## 2015-09-05 DIAGNOSIS — Z7952 Long term (current) use of systemic steroids: Secondary | ICD-10-CM | POA: Insufficient documentation

## 2015-09-05 DIAGNOSIS — R21 Rash and other nonspecific skin eruption: Secondary | ICD-10-CM | POA: Diagnosis present

## 2015-09-05 DIAGNOSIS — Z8659 Personal history of other mental and behavioral disorders: Secondary | ICD-10-CM | POA: Diagnosis not present

## 2015-09-05 DIAGNOSIS — L509 Urticaria, unspecified: Secondary | ICD-10-CM | POA: Insufficient documentation

## 2015-09-05 DIAGNOSIS — Z87891 Personal history of nicotine dependence: Secondary | ICD-10-CM | POA: Insufficient documentation

## 2015-09-05 LAB — CULTURE, GROUP A STREP (THRC)

## 2015-09-05 NOTE — ED Notes (Signed)
Pt states she is broke out in hives again, states she has been going through this for about 1 week.  Pt states she was given steroids 2 days ago and this morning they broke out again

## 2015-09-06 ENCOUNTER — Emergency Department (HOSPITAL_BASED_OUTPATIENT_CLINIC_OR_DEPARTMENT_OTHER)
Admission: EM | Admit: 2015-09-06 | Discharge: 2015-09-06 | Disposition: A | Discharge status: Home / Self Care | Payer: Medicaid Other | Attending: Emergency Medicine | Admitting: Emergency Medicine

## 2015-09-06 DIAGNOSIS — L509 Urticaria, unspecified: Secondary | ICD-10-CM

## 2015-09-06 MED ORDER — DIPHENHYDRAMINE HCL 25 MG PO TABS: 25.0000 mg | ORAL_TABLET | Freq: Four times a day (QID) | ORAL | Status: DC | PRN

## 2015-09-06 MED ORDER — FAMOTIDINE IN NACL 20-0.9 MG/50ML-% IV SOLN
20.0000 mg | Freq: Once | INTRAVENOUS | Status: AC
  Administered 2015-09-06: 20 mg via INTRAVENOUS
  Filled 2015-09-06: qty 50

## 2015-09-06 MED ORDER — METHYLPREDNISOLONE SODIUM SUCC 125 MG IJ SOLR
125.0000 mg | Freq: Once | INTRAMUSCULAR | Status: DC
  Filled 2015-09-06: qty 2

## 2015-09-06 MED ORDER — DIPHENHYDRAMINE HCL 50 MG/ML IJ SOLN
25.0000 mg | Freq: Once | INTRAMUSCULAR | Status: AC
  Administered 2015-09-06: 25 mg via INTRAVENOUS
  Filled 2015-09-06: qty 1

## 2015-09-06 MED ORDER — DEXAMETHASONE SODIUM PHOSPHATE 10 MG/ML IJ SOLN
10.0000 mg | Freq: Once | INTRAMUSCULAR | Status: AC
  Administered 2015-09-06: 10 mg via INTRAVENOUS
  Filled 2015-09-06: qty 1

## 2015-09-06 MED ORDER — METHYLPREDNISOLONE 4 MG PO TBPK: ORAL_TABLET | ORAL | Status: DC

## 2015-09-06 MED ORDER — FAMOTIDINE 20 MG PO TABS: 20.0000 mg | ORAL_TABLET | Freq: Two times a day (BID) | ORAL | Status: DC

## 2015-09-06 MED ORDER — HYDROCORTISONE 1 % EX CREA
TOPICAL_CREAM | Freq: Two times a day (BID) | CUTANEOUS | Status: DC
  Administered 2015-09-06: 1 via TOPICAL
  Filled 2015-09-06: qty 28

## 2015-09-06 NOTE — Discharge Instructions (Signed)
1. Medications: Prednisone, Benadryl, Pepcid, hydrocortisone lotion, usual home medications °2. Treatment: rest, drink plenty of fluids, take medications as prescribed °3. Follow Up: Please followup with your primary doctor in 3 days for discussion of your diagnoses and further evaluation after today's visit; if you do not have a primary care doctor use the resource guide provided to find one; followup with dermatology as needed; Return to the ER for difficulty breathing, return of allergic reaction or other concerning symptoms ° ° ° °Hives °Hives are itchy, red, swollen areas of the skin. They can vary in size and location on your body. Hives can come and go for hours or several days (acute hives) or for several weeks (chronic hives). Hives do not spread from person to person (noncontagious). They may get worse with scratching, exercise, and emotional stress. °CAUSES  °· Allergic reaction to food, additives, or drugs. °· Infections, including the common cold. °· Illness, such as vasculitis, lupus, or thyroid disease. °· Exposure to sunlight, heat, or cold. °· Exercise. °· Stress. °· Contact with chemicals. °SYMPTOMS  °· Red or white swollen patches on the skin. The patches may change size, shape, and location quickly and repeatedly. °· Itching. °· Swelling of the hands, feet, and face. This may occur if hives develop deeper in the skin. °DIAGNOSIS  °Your caregiver can usually tell what is wrong by performing a physical exam. Skin or blood tests may also be done to determine the cause of your hives. In some cases, the cause cannot be determined. °TREATMENT  °Mild cases usually get better with medicines such as antihistamines. Severe cases may require an emergency epinephrine injection. If the cause of your hives is known, treatment includes avoiding that trigger.  °HOME CARE INSTRUCTIONS  °· Avoid causes that trigger your hives. °· Take antihistamines as directed by your caregiver to reduce the severity of your  hives. Non-sedating or low-sedating antihistamines are usually recommended. Do not drive while taking an antihistamine. °· Take any other medicines prescribed for itching as directed by your caregiver. °· Wear loose-fitting clothing. °· Keep all follow-up appointments as directed by your caregiver. °SEEK MEDICAL CARE IF:  °· You have persistent or severe itching that is not relieved with medicine. °· You have painful or swollen joints. °SEEK IMMEDIATE MEDICAL CARE IF:  °· You have a fever. °· Your tongue or lips are swollen. °· You have trouble breathing or swallowing. °· You feel tightness in the throat or chest. °· You have abdominal pain. °These problems may be the first sign of a life-threatening allergic reaction. Call your local emergency services (911 in [place] °MAKE SURE YOU:  °· Understand these instructions. °· Will watch your condition. °· Will get help right away if you are not doing well or get worse. °  °This information is not intended to replace advice given to you by your health care provider. Make sure you discuss any questions you have with your health care provider. °  °Document Released: 04/29/2005 Document Revised: 05/04/2013 Document Reviewed: 07/23/2011 °Elsevier Interactive Patient Education ©2016 Elsevier Inc. ° °

## 2015-09-06 NOTE — ED Provider Notes (Signed)
CSN: [id number]     Arrival date & time 09/05/15  2220 History   First MD Initiated Contact with Patient 09/06/15 0029     Chief Complaint  Patient presents with  . Urticaria     (Consider location/radiation/quality/duration/timing/severity/associated sxs/prior Treatment) The history is provided by the patient and medical records. No language interpreter was used.     Nancy Palmer is a 30 y.o. female  with a hx of Varicella, HPV, anxiety presents to the Emergency Department complaining of gradual, persistent, progressively worsening hives onset this morning. She initially (several visits ago) presented with a rash which was suspicious for scabies and was treated with permethrin cream.   She reports developing an allergic reaction to this, including hives.  Pt reports she finished her steroid course 2 days ago.  Associated symptoms include itching and burning of the rash site.  Patient reports taking nothing for the rash prior to arrival.  She is not currently taking any Benadryl and Pepcid. She states that her throat feels sore but she is not having difficulty breathing, difficulty swallowing or stridor. She denies any fevers, cough/cold symptoms, abdominal pain, vomiting, diarrhea, or mouth sores. No one around her has this rash. She denies any new exposures to soaps, makeup, clothing, environments or detergents.    Record review shows that pt has treated 4 times for similar rash since 08/22/15.     Past Medical History  Diagnosis Date  . Smoker   . H/O varicella   . HPV (human papilloma virus) anogenital infection   . Panic attack   . Abnormal Pap smear    Past Surgical History  Procedure Laterality Date  . Mandible surgery    . Cryotherapy  2008 cervix   Family History  Problem Relation Age of Onset  . Other Neg Hx   . COPD Maternal Grandmother   . Diabetes Paternal Grandmother   . Heart disease Father   . COPD Paternal Grandfather    Social History  Substance Use Topics   . Smoking status: Former Smoker    Quit date: 12/11/2010  . Smokeless tobacco: Never Used  . Alcohol Use: No   OB History    Gravida Para Term Preterm AB TAB SAB Ectopic Multiple Living   Review of Systems  Constitutional: Negative for fever, diaphoresis, appetite change, fatigue and unexpected weight change.  HENT: Negative for mouth sores.   Eyes: Negative for visual disturbance.  Respiratory: Negative for cough, chest tightness, shortness of breath and wheezing.   Cardiovascular: Negative for chest pain.  Gastrointestinal: Negative for nausea, vomiting, abdominal pain, diarrhea and constipation.  Endocrine: Negative for polydipsia, polyphagia and polyuria.  Genitourinary: Negative for dysuria, urgency, frequency and hematuria.  Musculoskeletal: Negative for back pain and neck stiffness.  Skin: Positive for rash.  Allergic/Immunologic: Negative for immunocompromised state.  Neurological: Negative for syncope, light-headedness and headaches.  Hematological: Does not bruise/bleed easily.  Psychiatric/Behavioral: Negative for sleep disturbance. The patient is not nervous/anxious.       Allergies  Penicillins  Home Medications   Prior to Admission medications   Medication Sig Start Date End Date Taking? Authorizing Provider  diphenhydrAMINE (BENADRYL) 25 MG tablet Take 1 tablet (25 mg total) by mouth every 6 (six) hours as needed for itching (Rash). 09/06/15   Jenah Vanasten, PA-C  famotidine (PEPCID) 20 MG tablet Take 1 tablet (20 mg total) by mouth 2 (two) times daily. 09/06/15  Yann Biehn, PA-C  methylPREDNISolone (MEDROL DOSEPAK) 4 MG TBPK tablet Use as directed 09/06/15   Dahlia ClientHannah Kayleb Warshaw, PA-C  predniSONE (DELTASONE) 20 MG tablet Take 2 tablets (40 mg total) by mouth daily. 08/30/15   Satira SarkNicole Elizabeth Nadeau, PA-C   BP 113/84 mmHg  Pulse 85  Temp(Src) 98.4 F (36.9 C) (Oral)  Resp 18  Ht 5\' 1"  (1.549 m)  Wt 81.647 kg  BMI 34.03  kg/m2  SpO2 98%  LMP 08/14/2015 Physical Exam  Constitutional: She is oriented to person, place, and time. She appears well-developed and well-nourished. No distress.  HENT:  Head: Normocephalic and atraumatic.  Right Ear: Tympanic membrane, external ear and ear canal normal.  Left Ear: Tympanic membrane, external ear and ear canal normal.  Nose: Nose normal. No mucosal edema or rhinorrhea.  Mouth/Throat: Uvula is midline. No uvula swelling. No oropharyngeal exudate, posterior oropharyngeal edema, posterior oropharyngeal erythema or tonsillar abscesses.  No swelling of the uvula or oropharynx   Eyes: Conjunctivae are normal.  Neck: Normal range of motion.  Patent airway No stridor; normal phonation Handling secretions without difficulty  Cardiovascular: Normal rate, normal heart sounds and intact distal pulses.   No murmur heard. Pulmonary/Chest: Effort normal and breath sounds normal. No stridor. No respiratory distress. She has no wheezes.  No wheezes or rhonchi  Abdominal: Soft. Bowel sounds are normal. There is no tenderness.  Musculoskeletal: Normal range of motion. She exhibits no edema.  Neurological: She is alert and oriented to person, place, and time.  Skin: Skin is warm and dry. Rash noted. She is not diaphoretic.  Urticaria noted to the anterior and posterior neck, bilateral hips and dorsum of the bilateral feet and hands Mild excoriations - no induration or fluctuance to indicate secondary infection  Psychiatric: She has a normal mood and affect.  Nursing note and vitals reviewed.   ED Course  Procedures (including critical care time)  MDM   Final diagnoses:  Hives   Nancy PaceLisa M Rossmann presents with hives.  No evidence of anaphylaxis. No stridor, handling secretions and normal phonation.  Pt has a patent airway without stridor and is handling secretions without difficulty; no angioedema. No blisters, no pustules, no warmth, no draining sinus tracts, no superficial  abscesses, no bullous impetigo, no vesicles, no desquamation, no target lesions with dusky purpura or a central bulla. Not tender to touch. No concern for superimposed infection. No concern for SJS, TEN, TSS, tick borne illness, syphilis or other life-threatening condition. Will discharge home with Medrol Dosepak and again with, pepcid and recommend Benadryl as needed for pruritis.  She reports no primary care for follow-up. Discussed reasons to return to the emergency department including persistent rash, feelings of throat closing or other concerns.  1:31 AM Patient re-evaluated prior to dc, is hemodynamically stable, in no respiratory distress, and denies the feeling of throat closing. Rash has improved significantly.    Dierdre ForthHannah Anastasia Tompson, PA-C 09/06/15 0131  April Palumbo, MD 09/06/15 0157

## 2015-09-13 ENCOUNTER — Encounter (HOSPITAL_BASED_OUTPATIENT_CLINIC_OR_DEPARTMENT_OTHER): Payer: Self-pay

## 2015-09-13 ENCOUNTER — Emergency Department (HOSPITAL_BASED_OUTPATIENT_CLINIC_OR_DEPARTMENT_OTHER)
Admission: EM | Admit: 2015-09-13 | Discharge: 2015-09-13 | Disposition: A | Discharge status: Home / Self Care | Payer: Medicaid Other | Attending: Emergency Medicine | Admitting: Emergency Medicine

## 2015-09-13 DIAGNOSIS — L509 Urticaria, unspecified: Secondary | ICD-10-CM | POA: Diagnosis not present

## 2015-09-13 DIAGNOSIS — Z87891 Personal history of nicotine dependence: Secondary | ICD-10-CM | POA: Diagnosis not present

## 2015-09-13 DIAGNOSIS — R0981 Nasal congestion: Secondary | ICD-10-CM | POA: Insufficient documentation

## 2015-09-13 MED ORDER — CETIRIZINE HCL 10 MG PO TABS: 10.0000 mg | ORAL_TABLET | Freq: Every day | ORAL | Status: DC

## 2015-09-13 NOTE — ED Provider Notes (Signed)
CSN: [id number]     Arrival date & time 09/13/15  1206 History   First MD Initiated Contact with Patient 09/13/15 1237     Chief Complaint  Patient presents with  . Urticaria   Nancy PaceLisa M Palmer is a 30 y.o. female who presents to the ED Complaining of hives than present on her body for over the past month. The patient first began having symptoms of scabies and was seen in the emergency department and prescribed medication for scabies. After using the lotion to her body she developed hives the next day and was seen in the emergency department. She is prescribed steroids and Benadryl. She reports over the past month her hives have never gone completely away but are somewhat better. She reports they no longer itch. Patient has been seen twice in the emergency department for these hives. She just completed Medrol Dosepak yesterday. She has been taking Benadryl daily until yesterday. Today she is not taking any Benadryl or steroids. She reports she has a rash to her arms and legs that is slightly worse than yesterday. They do not itch. Patient also complains of some allergy symptoms with sneezing and runny nose. She denies contacts at home with similar symptoms. She denies any changes to her soaps, lotions, perfumes or detergents. No new plants or animals in the home. She denies fevers, tongue swelling, lip swelling, trouble swallowing, abdominal pain, nausea, vomiting, urinary symptoms, cough, shortness of breath, wheezing.  The patient reports she is eligible for Medicaid card but has been unable to go to the Medicaid office to activate the card. She has not followed up with PCP or dermatology.    Patient is a 30 y.o. female presenting with urticaria. The history is provided by the patient. No language interpreter was used.  Urticaria Associated symptoms include congestion and a rash. Pertinent negatives include no abdominal pain, chest pain, chills, coughing, fever, headaches, nausea, neck pain, sore throat or  vomiting.    Past Medical History  Diagnosis Date  . Smoker   . H/O varicella   . HPV (human papilloma virus) anogenital infection   . Panic attack   . Abnormal Pap smear    Past Surgical History  Procedure Laterality Date  . Mandible surgery    . Cryotherapy  2008 cervix   Family History  Problem Relation Age of Onset  . Other Neg Hx   . COPD Maternal Grandmother   . Diabetes Paternal Grandmother   . Heart disease Father   . COPD Paternal Grandfather    Social History  Substance Use Topics  . Smoking status: Former Smoker    Quit date: 12/11/2010  . Smokeless tobacco: Never Used  . Alcohol Use: No   OB History    Gravida Para Term Preterm AB TAB SAB Ectopic Multiple Living   4 4 3 1      4      Review of Systems  Constitutional: Negative for fever and chills.  HENT: Positive for congestion, rhinorrhea and sneezing. Negative for drooling, facial swelling, sore throat, trouble swallowing and voice change.   Eyes: Negative for visual disturbance.  Respiratory: Negative for cough, shortness of breath and wheezing.   Cardiovascular: Negative for chest pain.  Gastrointestinal: Negative for nausea, vomiting, abdominal pain and diarrhea.  Genitourinary: Negative for dysuria.  Musculoskeletal: Negative for back pain, neck pain and neck stiffness.  Skin: Positive for rash.  Neurological: Negative for headaches.      Allergies  Penicillins  Home Medications  Prior to Admission medications   Medication Sig Start Date End Date Taking? Authorizing Provider  cetirizine (ZYRTEC ALLERGY) 10 MG tablet Take 1 tablet (10 mg total) by mouth daily. 09/13/15   Everlene Farrier, PA-C  diphenhydrAMINE (BENADRYL) 25 MG tablet Take 1 tablet (25 mg total) by mouth every 6 (six) hours as needed for itching (Rash). 09/06/15   Hannah Muthersbaugh, PA-C   BP 118/99 mmHg  Pulse 107  Temp(Src) 98.9 F (37.2 C) (Oral)  Resp 18  Ht  (1.549 m)  Wt 81.647 kg  BMI 34.03 kg/m2  SpO2  100%  LMP 08/14/2015 Physical Exam  Constitutional: She appears well-developed and well-nourished. No distress.  Nontoxic appearing.  HENT:  Head: Normocephalic and atraumatic.  Right Ear: External ear normal.  Left Ear: External ear normal.  Mouth/Throat: Oropharynx is clear and moist.  Bilateral tympanic membranes are pearly-gray without erythema or loss of landmarks.  Third is clear. Uvula is midline without edema. No stridor. Speech is clear and coherent. No drooling.  Eyes: Conjunctivae are normal. Pupils are equal, round, and reactive to light. Right eye exhibits no discharge. Left eye exhibits no discharge.  Neck: Normal range of motion. Neck supple. No tracheal deviation present.  Cardiovascular: Normal rate, regular rhythm, normal heart sounds and intact distal pulses.  Exam reveals no gallop and no friction rub.   No murmur heard. Pulmonary/Chest: Effort normal and breath sounds normal. No stridor. No respiratory distress. She has no wheezes. She has no rales.  Lungs clear to auscultation bilaterally.  Abdominal: Soft. There is no tenderness.  Musculoskeletal: She exhibits no edema.  Lymphadenopathy:    She has no cervical adenopathy.  Neurological: She is alert. Coordination normal.  Skin: Skin is warm and dry. Rash noted. She is not diaphoretic. No erythema. No pallor.  Urticarial rash noted to her bilateral arms, upper thighs, abdomen and breasts. No vesicles or abscesses. No target lesions. The rash is nontender to touch. No discharge.  Psychiatric: She has a normal mood and affect. Her behavior is normal.  Nursing note and vitals reviewed.   ED Course  Procedures (including critical care time) Labs Review Labs Reviewed - No data to display  Imaging Review No results found.    EKG Interpretation None     Filed Vitals:   09/13/15 1216  BP: 118/99  Pulse: 107  Temp: 98.9 F (37.2 C)  TempSrc: Oral  Resp: 18  Height:  (1.549 m)  Weight: 81.647 kg   SpO2: 100%    MDM   Meds given in ED:  Medications - No data to display  Discharge Medication List as of 09/13/2015  1:55 PM    START taking these medications   Details  cetirizine (ZYRTEC ALLERGY) 10 MG tablet Take 1 tablet (10 mg total) by mouth daily., Starting 09/13/2015, Until Discontinued, Print        Final diagnoses:  Hives   This  is a 30 y.o. female who presents to the ED Complaining of hives than present on her body for over the past month. The patient first began having symptoms of scabies and was seen in the emergency department and prescribed medication for scabies. After using the lotion to her body she developed hives the next day and was seen in the emergency department. She is prescribed steroids and Benadryl. She reports over the past month her hives have never gone completely away but are somewhat better. She reports they no longer itch. Patient has been seen twice  in the emergency department for these hives. She just completed Medrol Dosepak yesterday. She has been taking Benadryl daily until yesterday. Today she is not taking any Benadryl or steroids. She reports she has a rash to her arms and legs that is slightly worse than yesterday. They do not itch. Patient also complains of some allergy symptoms with sneezing and runny nose. She denies contacts at home with similar symptoms. She denies any changes to her soaps, lotions, perfumes or detergents. No new plants or animals in the home.  On exam the patient is afebrile nontoxic appearing. No tongue or lip swelling. No angioedema. Throat is clear. Lungs are clear to auscultation bilaterally. She does have urticarial rash noted to her bilateral upper thighs, abdomen, breasts and upper arms. No abscesses, vesicles or bulla. No target lesions. The rash is nontender to palpation. Patient has not been able to follow-up with primary care or dermatology. She reports she will work on getting her Medicaid card. I encouraged her to do  so. I encouraged her to follow-up with dermatology. Will have her start Zyrtec for her allergy symptoms as well as to hives. Advised she can use Benadryl as needed for any further imaging or further rash. Will not start on steroid pack is a patient has done several courses already. She does not believe the steroid has been helping. I discussed her taking specific return precautions. I advised the patient to follow-up with their primary care provider this week. I advised the patient to return to the emergency department with new or worsening symptoms or new concerns. The patient verbalized understanding and agreement with plan.       Everlene Farrier, PA-C 09/13/15 1407  Marily Memos, MD 09/14/15 1754

## 2015-09-13 NOTE — Discharge Instructions (Signed)
Hives Hives are itchy, red, swollen areas of the skin. They can vary in size and location on your body. Hives can come and go for hours or several days (acute hives) or for several weeks (chronic hives). Hives do not spread from person to person (noncontagious). They may get worse with scratching, exercise, and emotional stress. CAUSES   Allergic reaction to food, additives, or drugs.  Infections, including the common cold.  Illness, such as vasculitis, lupus, or thyroid disease.  Exposure to sunlight, heat, or cold.  Exercise.  Stress.  Contact with chemicals. SYMPTOMS   Red or white swollen patches on the skin. The patches may change size, shape, and location quickly and repeatedly.  Itching.  Swelling of the hands, feet, and face. This may occur if hives develop deeper in the skin. DIAGNOSIS  Your caregiver can usually tell what is wrong by performing a physical exam. Skin or blood tests may also be done to determine the cause of your hives. In some cases, the cause cannot be determined. TREATMENT  Mild cases usually get better with medicines such as antihistamines. Severe cases may require an emergency epinephrine injection. If the cause of your hives is known, treatment includes avoiding that trigger.  HOME CARE INSTRUCTIONS   Avoid causes that trigger your hives.  Take antihistamines as directed by your caregiver to reduce the severity of your hives. Non-sedating or low-sedating antihistamines are usually recommended. Do not drive while taking an antihistamine.  Take any other medicines prescribed for itching as directed by your caregiver.  Wear loose-fitting clothing.  Keep all follow-up appointments as directed by your caregiver. SEEK MEDICAL CARE IF:   You have persistent or severe itching that is not relieved with medicine.  You have painful or swollen joints. SEEK IMMEDIATE MEDICAL CARE IF:   You have a fever.  Your tongue or lips are swollen.  You have  trouble breathing or swallowing.  You feel tightness in the throat or chest.  You have abdominal pain. These problems may be the first sign of a life-threatening allergic reaction. Call your local emergency services (911 in [place] MAKE SURE YOU:   Understand these instructions.  Will watch your condition.  Will get help right away if you are not doing well or get worse.   This information is not intended to replace advice given to you by your health care provider. Make sure you discuss any questions you have with your health care provider.   Document Released: 04/29/2005 Document Revised: 05/04/2013 Document Reviewed: 07/23/2011 Elsevier Interactive Patient Education 2016 [employer] Inc[employer]  

## 2015-09-13 NOTE — ED Notes (Signed)
Scattered hives x 1 month-seen here multiple times for same-c/o craps to both arms started yesterday-NAD-steady gait

## 2015-09-14 ENCOUNTER — Emergency Department (HOSPITAL_COMMUNITY)
Admission: EM | Admit: 2015-09-14 | Discharge: 2015-09-15 | Disposition: A | Discharge status: Home / Self Care | Payer: Medicaid Other | Attending: Emergency Medicine | Admitting: Emergency Medicine

## 2015-09-14 ENCOUNTER — Encounter (HOSPITAL_COMMUNITY): Payer: Self-pay

## 2015-09-14 DIAGNOSIS — Z87891 Personal history of nicotine dependence: Secondary | ICD-10-CM | POA: Diagnosis not present

## 2015-09-14 DIAGNOSIS — Z8619 Personal history of other infectious and parasitic diseases: Secondary | ICD-10-CM | POA: Insufficient documentation

## 2015-09-14 DIAGNOSIS — L509 Urticaria, unspecified: Secondary | ICD-10-CM | POA: Insufficient documentation

## 2015-09-14 DIAGNOSIS — Z7951 Long term (current) use of inhaled steroids: Secondary | ICD-10-CM | POA: Insufficient documentation

## 2015-09-14 DIAGNOSIS — Z8659 Personal history of other mental and behavioral disorders: Secondary | ICD-10-CM | POA: Diagnosis not present

## 2015-09-14 DIAGNOSIS — R21 Rash and other nonspecific skin eruption: Secondary | ICD-10-CM | POA: Diagnosis present

## 2015-09-14 DIAGNOSIS — Z880 Allergy status to penicillin: Secondary | ICD-10-CM | POA: Diagnosis not present

## 2015-09-14 DIAGNOSIS — Z79899 Other long term (current) drug therapy: Secondary | ICD-10-CM | POA: Insufficient documentation

## 2015-09-14 MED ORDER — LORATADINE 10 MG PO TABS
10.0000 mg | ORAL_TABLET | Freq: Every day | ORAL | Status: DC
  Administered 2015-09-15: 10 mg via ORAL
  Filled 2015-09-14: qty 1

## 2015-09-14 MED ORDER — CETIRIZINE HCL 5 MG/5ML PO SYRP: 10.0000 mg | ORAL_SOLUTION | Freq: Once | ORAL | Status: DC

## 2015-09-14 MED ORDER — EPINEPHRINE HCL 1 MG/ML IJ SOLN
0.3000 mg | Freq: Once | INTRAMUSCULAR | Status: AC
  Administered 2015-09-15: 0.3 mg via SUBCUTANEOUS
  Filled 2015-09-14: qty 1

## 2015-09-14 MED ORDER — FAMOTIDINE 20 MG PO TABS
20.0000 mg | ORAL_TABLET | Freq: Once | ORAL | Status: AC
  Administered 2015-09-15: 20 mg via ORAL
  Filled 2015-09-14: qty 1

## 2015-09-14 MED ORDER — DEXAMETHASONE SODIUM PHOSPHATE 10 MG/ML IJ SOLN
10.0000 mg | Freq: Once | INTRAMUSCULAR | Status: AC
  Administered 2015-09-15: 10 mg via INTRAMUSCULAR
  Filled 2015-09-14: qty 1

## 2015-09-14 MED ORDER — DIPHENHYDRAMINE HCL 25 MG PO CAPS
25.0000 mg | ORAL_CAPSULE | Freq: Once | ORAL | Status: AC
  Administered 2015-09-15: 25 mg via ORAL
  Filled 2015-09-14: qty 1

## 2015-09-14 NOTE — ED Provider Notes (Signed)
Patient seen and evaluated. Has skin reaction urticaria. No GI complaints. Cardiovascular stable. Normal pharynx. Clear lungs.   Patient was treated for scabies with [place]. Broke up the next day. On the same day bottle, and moved into a house. They took down some paneling and noticed a lot of mold. She states that they did spray with Clorox but put up sheet rock. She's had continuous recurrent episodes of urticaria since that time. My concern is that she is very likely being reexposed to something daily and potentially in her house. Vascular to try to take 72 hours away from the house and see if she gets improvement. In the meanwhile I feel she may improve with prednisone. She was given prednisone her first week and resolved. Her most recent low-dose Medrol she did not. Plan Benadryl, Zyrtec, famotidine, Singulair, prednisone. Given subcutaneous epi here for symptom relief.  Rolland PorterMark Gaia Gullikson, MD 09/14/15 [phone]

## 2015-09-14 NOTE — ED Notes (Signed)
Pt complains of a generalized rash that she's had for a few weeks and has been seen a few times for. She states that it's getting worse and has been given several creams and pills, no relief. She states that she moved into a house that had mold in it and that's all she can think of that's she's been around. She now is complaining that she's aching around her knees and wrists at her joints where the rash is

## 2015-09-15 MED ORDER — FAMOTIDINE 20 MG PO TABS: 20.0000 mg | ORAL_TABLET | Freq: Two times a day (BID) | ORAL | Status: DC

## 2015-09-15 MED ORDER — FLUTICASONE PROPIONATE HFA 110 MCG/ACT IN AERO: 1.0000 | INHALATION_SPRAY | Freq: Two times a day (BID) | RESPIRATORY_TRACT | Status: DC

## 2015-09-15 MED ORDER — PREDNISONE 10 MG PO TABS: 20.0000 mg | ORAL_TABLET | Freq: Every day | ORAL | Status: DC

## 2015-09-15 NOTE — Discharge Instructions (Signed)
Hives Hives are itchy, red, swollen areas of the skin. They can vary in size and location on your body. Hives can come and go for hours or several days (acute hives) or for several weeks (chronic hives). Hives do not spread from person to person (noncontagious). They may get worse with scratching, exercise, and emotional stress. CAUSES   Allergic reaction to food, additives, or drugs.  Infections, including the common cold.  Illness, such as vasculitis, lupus, or thyroid disease.  Exposure to sunlight, heat, or cold.  Exercise.  Stress.  Contact with chemicals. SYMPTOMS   Red or white swollen patches on the skin. The patches may change size, shape, and location quickly and repeatedly.  Itching.  Swelling of the hands, feet, and face. This may occur if hives develop deeper in the skin. DIAGNOSIS  Your caregiver can usually tell what is wrong by performing a physical exam. Skin or blood tests may also be done to determine the cause of your hives. In some cases, the cause cannot be determined. TREATMENT  Mild cases usually get better with medicines such as antihistamines. Severe cases may require an emergency epinephrine injection. If the cause of your hives is known, treatment includes avoiding that trigger.  HOME CARE INSTRUCTIONS   Avoid causes that trigger your hives.  Take antihistamines as directed by your caregiver to reduce the severity of your hives. Non-sedating or low-sedating antihistamines are usually recommended. Do not drive while taking an antihistamine.  Take any other medicines prescribed for itching as directed by your caregiver.  Wear loose-fitting clothing.  Keep all follow-up appointments as directed by your caregiver. SEEK MEDICAL CARE IF:   You have persistent or severe itching that is not relieved with medicine.  You have painful or swollen joints. SEEK IMMEDIATE MEDICAL CARE IF:   You have a fever.  Your tongue or lips are swollen.  You have  trouble breathing or swallowing.  You feel tightness in the throat or chest.  You have abdominal pain. These problems may be the first sign of a life-threatening allergic reaction. Call your local emergency services (911 in [place] MAKE SURE YOU:   Understand these instructions.  Will watch your condition.  Will get help right away if you are not doing well or get worse.   This information is not intended to replace advice given to you by your health care provider. Make sure you discuss any questions you have with your health care provider.   Document Released: 04/29/2005 Document Revised: 05/04/2013 Document Reviewed: 07/23/2011 Elsevier Interactive Patient Education 2016 [employer] Inc[employer]  

## 2015-09-18 NOTE — ED Provider Notes (Signed)
CSN: [id number]     Arrival date & time 09/14/15  2045 History   First MD Initiated Contact with Patient 09/14/15 2248     Chief Complaint  Patient presents with  . Rash    HPI Comments: [age] year old female presents with a rash. She states she has had a waxing and waning urticarial rash for the past month, was just seen here yesterday. It first started after she was treated with Permethrin for scabies. Since then she has been back about 7 times due her rash not going away. It is pruritic. She states the most it ever when away was when she had a shot and was put on a steroid burst. She has been using Benadryl daily without much relief. She has also just finished a Medrol dose pack 2 days ago without relief. Today the rash is the worst around her thighs, and is also on the arms. She denies changes in soaps, lotions, detergents. Denies eating different foods. The only allergen she can think of is that she moved in to a new home one month ago and has been renovating it and there has been exposure to mold. Denies fever, chills, chest pain, SOB, wheezing, abdominal pain, N/V/D.   Patient is a 30 y.o. female presenting with rash.  Rash Associated symptoms: no abdominal pain, no fever, no shortness of breath and not wheezing     Past Medical History  Diagnosis Date  . Smoker   . H/O varicella   . HPV (human papilloma virus) anogenital infection   . Panic attack   . Abnormal Pap smear    Past Surgical History  Procedure Laterality Date  . Mandible surgery    . Cryotherapy  2008 cervix   Family History  Problem Relation Age of Onset  . Other Neg Hx   . COPD Maternal Grandmother   . Diabetes Paternal Grandmother   . Heart disease Father   . COPD Paternal Grandfather    Social History  Substance Use Topics  . Smoking status: Former Smoker    Quit date: 12/11/2010  . Smokeless tobacco: Never Used  . Alcohol Use: No   OB History    Gravida Para Term Preterm AB TAB SAB Ectopic Multiple  Living   4 4 3 1      4      Review of Systems  Constitutional: Negative for fever and chills.  Respiratory: Negative for shortness of breath, wheezing and stridor.   Cardiovascular: Negative for chest pain.  Gastrointestinal: Negative for abdominal pain.  Skin: Positive for rash.  All other systems reviewed and are negative.     Allergies  Penicillins  Home Medications   Prior to Admission medications   Medication Sig Start Date End Date Taking? Authorizing Provider  cetirizine (ZYRTEC ALLERGY) 10 MG tablet Take 1 tablet (10 mg total) by mouth daily. 09/13/15   Everlene FarrierWilliam Dansie, PA-C  diphenhydrAMINE (BENADRYL) 25 MG tablet Take 1 tablet (25 mg total) by mouth every 6 (six) hours as needed for itching (Rash). 09/06/15   Hannah Muthersbaugh, PA-C  famotidine (PEPCID) 20 MG tablet Take 1 tablet (20 mg total) by mouth 2 (two) times daily. 09/15/15   Bethel BornKelly Marie Kavonte Bearse, PA-C  fluticasone (FLOVENT HFA) 110 MCG/ACT inhaler Inhale 1 puff into the lungs 2 (two) times daily. 09/15/15   Bethel BornKelly Marie Tiernan Suto, PA-C  predniSONE (DELTASONE) 10 MG tablet Take 2 tablets (20 mg total) by mouth daily. Take 20mg  two times a day for 5 days. Then  take  once a day for 5 days 09/15/15   Bethel Born, PA-C   BP 120/78 mmHg  Pulse 70  Temp(Src) 99.1 F (37.3 C) (Oral)  Resp 18  SpO2 100%  LMP 08/14/2015   Physical Exam  Constitutional: She is oriented to person, place, and time. She appears well-developed and well-nourished. No distress.  HENT:  Head: Normocephalic and atraumatic.  Nose: No mucosal edema or rhinorrhea.  Mouth/Throat: Uvula is midline, oropharynx is clear and moist and mucous membranes are normal.  Eyes: Conjunctivae are normal. Pupils are equal, round, and reactive to light. Right eye exhibits no discharge. Left eye exhibits no discharge. No scleral icterus.  Neck: Normal range of motion.  Cardiovascular: Normal rate and regular rhythm.  Exam reveals no gallop and no friction rub.   No  murmur heard. Pulmonary/Chest: Effort normal and breath sounds normal. No respiratory distress. She has no wheezes. She has no rales. She exhibits no tenderness.  Abdominal: Soft. There is no tenderness.  Neurological: She is alert and oriented to person, place, and time.  Skin: Skin is warm and dry. Rash noted.  Diffuse urticarial rash on legs, circumferential thighs, chest, back, arms  Psychiatric: She has a normal mood and affect.      ED Course  Procedures (including critical care time) Labs Review Labs Reviewed - No data to display  Imaging Review No results found. I have personally reviewed and evaluated these images and lab results as part of my medical decision-making.   EKG Interpretation None     Meds given in ED:  Medications  diphenhydrAMINE (BENADRYL) capsule 25 mg (25 mg Oral Given 09/15/15 0015)  famotidine (PEPCID) tablet 20 mg (20 mg Oral Given 09/15/15 0015)  dexamethasone (DECADRON) injection 10 mg (10 mg Intramuscular Given 09/15/15 0016)  EPINEPHrine (ADRENALIN) injection 0.3 mg (0.3 mg Subcutaneous Given 09/15/15 0020)    Discharge Medication List as of 09/15/2015 12:32 AM    START taking these medications   Details  fluticasone (FLOVENT HFA) 110 MCG/ACT inhaler Inhale 1 puff into the lungs 2 (two) times daily., Starting 09/15/2015, Until Discontinued, Print    predniSONE (DELTASONE) 10 MG tablet Take 2 tablets (20 mg total) by mouth daily. Take  two times a day for 5 days. Then take  once a day for 5 days, Starting 09/15/2015, Until Discontinued, Print         MDM   Final diagnoses:  Urticaria   30 year old female who presents with rash. This has been an ongoing problem for the past month and it is unclear what she is allergic to. It is possible she is being exposed to environmental allergens in her new house.  Since patient states she had the most relief from steroid burst, will give her one shot of steroids IM here and another steroid burst as  well as H1+H2 blocker. Also will try epipen for symptomatic relief. She does not have any signs of anaphylaxis - no tongue or lip swelling, no angioedema. She is not in respiratory distress, lungs are CTA.   Shared visit with Dr. Fayrene Fearing. Will rx steriod burst as well as a fluticasone inhaler and recommend H1+H2 blocker at home. Patient is NAD, non-toxic, with stable VS. Patient is informed of clinical course, understands medical decision making process, and agrees with plan. Opportunity for questions provided and all questions answered. Return precautions given.    Bethel Born, PA-C 09/18/15 1039  Rolland Porter, MD 09/29/15 [phone]

## 2016-01-28 ENCOUNTER — Emergency Department (HOSPITAL_BASED_OUTPATIENT_CLINIC_OR_DEPARTMENT_OTHER)
Admission: EM | Admit: 2016-01-28 | Discharge: 2016-01-29 | Disposition: A | Discharge status: Home / Self Care | Payer: Medicaid Other | Attending: Emergency Medicine | Admitting: Emergency Medicine

## 2016-01-28 ENCOUNTER — Encounter (HOSPITAL_BASED_OUTPATIENT_CLINIC_OR_DEPARTMENT_OTHER): Payer: Self-pay | Admitting: Emergency Medicine

## 2016-01-28 DIAGNOSIS — Z87891 Personal history of nicotine dependence: Secondary | ICD-10-CM | POA: Insufficient documentation

## 2016-01-28 DIAGNOSIS — K089 Disorder of teeth and supporting structures, unspecified: Secondary | ICD-10-CM | POA: Insufficient documentation

## 2016-01-28 DIAGNOSIS — K0889 Other specified disorders of teeth and supporting structures: Secondary | ICD-10-CM

## 2016-01-28 MED ORDER — AMOXICILLIN-POT CLAVULANATE 875-125 MG PO TABS: 1.0000 | ORAL_TABLET | Freq: Two times a day (BID) | ORAL | 0 refills | Status: DC

## 2016-01-28 NOTE — ED Notes (Signed)
Care assumed at time of d/c. Pt seen by EDP prior to RN assessment, see MD notes, orders received to d/c.  

## 2016-01-28 NOTE — Discharge Instructions (Signed)
Take antibiotics as prescribed. Take ibuprofen at home as needed for pain. Follow up with a dentist as soon as possible.

## 2016-01-28 NOTE — ED Triage Notes (Signed)
Patient reports dental pain to upper left jaw.  States unsure of which tooth this is but states it has been bothering her for 3-4 nights. Reports she felt a "knot" on her gum and states this worried her and prompted her to come to ER.

## 2016-01-28 NOTE — ED Provider Notes (Signed)
[facility] Provider [facility]   CSN: [id number] Arrival date & time: 01/28/16  2137 By signing my name below, I, Levon HedgerElizabeth Hall, attest that this documentation has been prepared under the direction and in the presence of non-physician practitioner, Noelle PennerSerena Harshini Trent, PA-C Electronically Signed: Levon HedgerElizabeth Hall, Scribe. 01/28/2016. 11:28 PM.   History   Chief Complaint Chief Complaint  Patient presents with  . Dental Pain   HPI Nancy PaceLisa M Pothier is a 30 y.o. female who presents to the Emergency Department complaining of constant upper left dental pain for 3-4 days. Pt notes a "knot" to the area. She states she has been taking ibuprofen for the pain with some relief. She states that she has had intermittent dental pain for several months and will take friends' antibiotics as needed. She is not currently followed by a dentist. Pt is otherwise in her normal health and has no other complaints at this time.  The history is provided by the patient. No language interpreter was used.   Past Medical History:  Diagnosis Date  . Abnormal Pap smear   . H/O varicella   . HPV (human papilloma virus) anogenital infection   . Panic attack   . Smoker     Patient Active Problem List   Diagnosis Date Noted  . Variable fetal heart rate decelerations, antepartum 11/01/2013  . Status post normal vaginal delivery 11/01/2013    Past Surgical History:  Procedure Laterality Date  . CRYOTHERAPY  2008 cervix  . MANDIBLE SURGERY      OB History    Gravida Para Term Preterm AB Living   4 4 3 1   4    SAB TAB Ectopic Multiple Live Births           4     Home Medications    Prior to Admission medications   Medication Sig Start Date End Date Taking? Authorizing Provider  cetirizine (ZYRTEC ALLERGY) 10 MG tablet Take 1 tablet (10 mg total) by mouth daily. 09/13/15   Everlene FarrierWilliam Dansie, PA-C  diphenhydrAMINE (BENADRYL) 25 MG tablet Take 1 tablet (25 mg total) by mouth every 6 (six) hours as needed for itching  (Rash). 09/06/15   Hannah Muthersbaugh, PA-C  famotidine (PEPCID) 20 MG tablet Take 1 tablet (20 mg total) by mouth 2 (two) times daily. 09/15/15   Bethel BornKelly Marie Gekas, PA-C  fluticasone (FLOVENT HFA) 110 MCG/ACT inhaler Inhale 1 puff into the lungs 2 (two) times daily. 09/15/15   Bethel BornKelly Marie Gekas, PA-C  predniSONE (DELTASONE) 10 MG tablet Take 2 tablets (20 mg total) by mouth daily. Take 20mg  two times a day for 5 days. Then take 20mg  once a day for 5 days 09/15/15   Bethel BornKelly Marie Gekas, PA-C    Family History Family History  Problem Relation Age of Onset  . COPD Maternal Grandmother   . Diabetes Paternal Grandmother   . Heart disease Father   . COPD Paternal Grandfather   . Other Neg Hx     Social History Social History  Substance Use Topics  . Smoking status: Former Smoker    Quit date: 12/11/2010  . Smokeless tobacco: Never Used  . Alcohol use No     Allergies   Penicillins  Review of Systems Review of Systems  Constitutional: Negative for fever.  HENT: Positive for dental problem.   All other systems reviewed and are negative.  Physical Exam Updated Vital Signs BP 126/77 (BP Location: Left Arm)   Pulse 72   Temp 98.1 F (36.7  C) (Oral)   Resp 18   Ht 5\' 1"  (1.549 m)   Wt 185 lb (83.9 kg)   LMP 12/12/2015   SpO2 100%   BMI 34.96 kg/m   Physical Exam  Constitutional: She is oriented to person, place, and time. She appears well-developed and well-nourished. No distress.  HENT:  Head: Normocephalic and atraumatic.  Left upper quadrant with multiple broken and carious teeth. Gums mildly inflammed. No gross abscess. Uvula midline no trismus.  Eyes: Conjunctivae are normal.  Cardiovascular: Normal rate.   Pulmonary/Chest: Effort normal.  Abdominal: She exhibits no distension.  Neurological: She is alert and oriented to person, place, and time.  Skin: Skin is warm and dry.  Psychiatric: She has a normal mood and affect.  Nursing note and vitals reviewed.  ED  Treatments / Results  DIAGNOSTIC STUDIES:  Oxygen Saturation is 100% on RA, normal by my interpretation.    COORDINATION OF CARE:  11:35 PM Pt to f/u with dentist. Discussed treatment plan with pt at bedside and pt agreed to plan.  Labs (all labs ordered are listed, but only abnormal results are displayed) Labs Reviewed - No data to display  EKG  EKG Interpretation None       Radiology No results found.  Procedures Procedures (including critical care time)  Medications Ordered in ED Medications - No data to display   Initial Impression / Assessment and Plan / ED Course  I have reviewed the triage vital signs and the nursing notes.  Pertinent labs & imaging results that were available during my care of the patient were reviewed by me and considered in my medical decision making (see chart for details).  Clinical Course   Pt needs to see dentist for definitive treatment. No gross abscess seen in the ED. Pt afebrile and nontoxic appearing. rx given for course of abx. Ibuprofen has been sufficient for pain control at home. Dental resource guide given.  Final Clinical Impressions(s) / ED Diagnoses   Final diagnoses:  Pain, dental  I personally performed the services described in this documentation, which was scribed in my presence. The recorded information has been reviewed and is accurate.   New Prescriptions Discharge Medication List as of 01/28/2016 11:50 PM    START taking these medications   Details  amoxicillin-clavulanate (AUGMENTIN) 875-125 MG tablet Take 1 tablet by mouth every 12 (twelve) hours., Starting Sun 01/28/2016, Print         Carlene Coria, PA-C 01/29/16 1424    Tomasita Crumble, MD 02/01/16 [phone]

## 2016-01-29 NOTE — ED Notes (Addendum)
Pt alert, NAD, calm, interactive, resps e/u, no dyspnea noted, seen by EDP prior to RN assessment, see MD notes, orders received d/c, care assumed at time of d/c.

## 2016-02-18 ENCOUNTER — Encounter (HOSPITAL_BASED_OUTPATIENT_CLINIC_OR_DEPARTMENT_OTHER): Payer: Self-pay | Admitting: Emergency Medicine

## 2016-02-18 ENCOUNTER — Emergency Department (HOSPITAL_BASED_OUTPATIENT_CLINIC_OR_DEPARTMENT_OTHER)
Admission: EM | Admit: 2016-02-18 | Discharge: 2016-02-18 | Disposition: A | Discharge status: Home / Self Care | Payer: Medicaid Other | Attending: Emergency Medicine | Admitting: Emergency Medicine

## 2016-02-18 DIAGNOSIS — L509 Urticaria, unspecified: Secondary | ICD-10-CM

## 2016-02-18 DIAGNOSIS — Z79899 Other long term (current) drug therapy: Secondary | ICD-10-CM | POA: Diagnosis not present

## 2016-02-18 DIAGNOSIS — Z87891 Personal history of nicotine dependence: Secondary | ICD-10-CM | POA: Diagnosis not present

## 2016-02-18 MED ORDER — PREDNISONE 10 MG (21) PO TBPK: ORAL_TABLET | ORAL | 0 refills | Status: DC

## 2016-02-18 MED ORDER — CETIRIZINE HCL 10 MG PO TABS: 10.0000 mg | ORAL_TABLET | Freq: Every day | ORAL | 0 refills | Status: DC

## 2016-02-18 MED ORDER — METHYLPREDNISOLONE SODIUM SUCC 125 MG IJ SOLR
125.0000 mg | Freq: Once | INTRAMUSCULAR | Status: AC
  Administered 2016-02-18: 125 mg via INTRAMUSCULAR
  Filled 2016-02-18: qty 2

## 2016-02-18 MED ORDER — FAMOTIDINE 20 MG PO TABS: 20.0000 mg | ORAL_TABLET | Freq: Two times a day (BID) | ORAL | 0 refills | Status: DC

## 2016-02-18 MED ORDER — FAMOTIDINE 20 MG PO TABS
20.0000 mg | ORAL_TABLET | Freq: Once | ORAL | Status: AC
  Administered 2016-02-18: 20 mg via ORAL
  Filled 2016-02-18: qty 1

## 2016-02-18 NOTE — Discharge Instructions (Signed)
Take medications as prescribed. Follow up with an allergist as soon as possible. Return to the ER for new or worsening symptoms.

## 2016-02-18 NOTE — ED Provider Notes (Signed)
[facility]   CSN: [id number] Arrival date & time: 02/18/16  2025  By signing my name below, I, [name], attest that this documentation has been prepared under the direction and in the presence of non-physician practitioner, Carlene CoriaSerena Y Aastha Dayley, PA-C. Electronically Signed: [name], Scribe. 02/18/2016. 9:12 PM.  History   Chief Complaint Chief Complaint  Patient presents with  . Urticaria   The history is provided by the patient. No language interpreter was used.   HPI Comments: Nancy Palmer is a 30 y.o. female who presents to the Emergency Department complaining of gradually worsening, intermittent, generalized rash that began 3 weeks ago and worsened today. Pt describes her rash as pruritic and states it "pops up" around her face and different muscles and joints with associated pain. She notes it is exacerbated when she is at work in which she has to use her hands often. She states she was seen in the ED on 9/17 for dental pain and was prescribed amoxicillin; she notes her rash began soon after taking this medication. She notes she has taken amoxicillin multiple times before with no similar symptoms and has not taken if for the past 2 weeks. She reports hx of a similar rash 6 months ago but states her previous rash was more centralized to a particular area and this rash is more "blotchy." She states she has taken benadryl with no relief of her rash but mild relief of itching. She denies new pets, laundry detergent, chills, fever, nausea, vomiting and FHx of psoriasis. She notes she is sexually active and has had the same partner for the past 7 years; however, she has not been tested for STDs within the past 5 years.   Past Medical History:  Diagnosis Date  . Abnormal Pap smear   . H/O varicella   . HPV (human papilloma virus) anogenital infection   . Panic attack   . Smoker     Patient Active Problem List   Diagnosis Date Noted  . Variable fetal heart rate  decelerations, antepartum 11/01/2013  . Status post normal vaginal delivery 11/01/2013    Past Surgical History:  Procedure Laterality Date  . CRYOTHERAPY  2008 cervix  . MANDIBLE SURGERY      OB History    Gravida Para Term Preterm AB Living   4 4 3 1   4    SAB TAB Ectopic Multiple Live Births           4     Home Medications    Prior to Admission medications   Medication Sig Start Date End Date Taking? Authorizing Provider  amoxicillin-clavulanate (AUGMENTIN) 875-125 MG tablet Take 1 tablet by mouth every 12 (twelve) hours. 01/28/16   Ace GinsSerena Y Melrose Kearse, PA-C  cetirizine (ZYRTEC ALLERGY) 10 MG tablet Take 1 tablet (10 mg total) by mouth daily. 09/13/15   Everlene FarrierWilliam Dansie, PA-C  diphenhydrAMINE (BENADRYL) 25 MG tablet Take 1 tablet (25 mg total) by mouth every 6 (six) hours as needed for itching (Rash). 09/06/15   Hannah Muthersbaugh, PA-C  famotidine (PEPCID) 20 MG tablet Take 1 tablet (20 mg total) by mouth 2 (two) times daily. 09/15/15   Bethel BornKelly Marie Gekas, PA-C  fluticasone (FLOVENT HFA) 110 MCG/ACT inhaler Inhale 1 puff into the lungs 2 (two) times daily. 09/15/15   Bethel BornKelly Marie Gekas, PA-C  predniSONE (DELTASONE) 10 MG tablet Take 2 tablets (20 mg total) by mouth daily. Take 20mg  two times a day for 5 days. Then take 20mg   once a day for 5 days 09/15/15   Bethel Born, PA-C    Family History Family History  Problem Relation Age of Onset  . COPD Maternal Grandmother   . Diabetes Paternal Grandmother   . Heart disease Father   . COPD Paternal Grandfather   . Other Neg Hx     Social History Social History  Substance Use Topics  . Smoking status: Former Smoker    Quit date: 12/11/2010  . Smokeless tobacco: Never Used  . Alcohol use No     Allergies   Penicillins   Review of Systems Review of Systems 10 systems reviewed and all are negative for acute change except as noted in the HPI.  Physical Exam Updated Vital Signs BP 105/77   Pulse 78   Temp 98 F (36.7 C)   Resp  18   Ht 5\' 1"  (1.549 m)   Wt 180 lb (81.6 kg)   LMP 02/15/2016   SpO2 100%   BMI 34.01 kg/m   Physical Exam  Constitutional: She is oriented to person, place, and time. No distress.  Breathing comfortably NAD  HENT:  Head: Atraumatic.  Right Ear: External ear normal.  Left Ear: External ear normal.  Nose: Nose normal.  Mouth/Throat: Oropharynx is clear and moist.  No intra-oral lesions  Eyes: Conjunctivae are normal. No scleral icterus.  Cardiovascular: Normal rate and regular rhythm.   Pulmonary/Chest: Effort normal and breath sounds normal. No respiratory distress. She has no wheezes. She has no rales.  Abdominal: She exhibits no distension.  Neurological: She is alert and oriented to person, place, and time.  Skin: Skin is warm and dry. She is not diaphoretic.  Blanching maculopapular rash with excoriation marks scattered over entire body including face, abdomen, back, bilateral upper and lower extremities (including palms and soles). No vesicles or pustules. No joint nodules.  Psychiatric: She has a normal mood and affect. Her behavior is normal.  Nursing note and vitals reviewed.    ED Treatments / Results  Labs (all labs ordered are listed, but only abnormal results are displayed) Labs Reviewed - No data to display  EKG  EKG Interpretation None       Radiology No results found.  Procedures Procedures (including critical care time)  Medications Ordered in ED Medications - No data to display  DIAGNOSTIC STUDIES:  Oxygen Saturation is 100% on RA, normal by my interpretation.    COORDINATION OF CARE:  9:10 PM Discussed treatment plan with pt at bedside and pt agreed to plan.  Initial Impression / Assessment and Plan / ED Course  I have reviewed the triage vital signs and the nursing notes.  Pertinent labs & imaging results that were available during my care of the patient were reviewed by me and considered in my medical decision making (see chart for  details).  Clinical Course   Pt with three weeks of urticarial rash. No new exposures. Does not appear infectious or consistent with SJS/TEN. I wonder if something in her living environement is causing this reaction as she first developed a similar rash about six months ago after moving to a new home. However, apparently inspections have been done since then. Pt is nontoxic appearing here with no evidence of airway involvement. Lungs CTAB. Hemodynamically stable. Will treat with prednisone taper, pepcid, and benadryl vs zyrtec. Encouraged to follow up with allergist when financially feasible. Strict ER return precautions given.  I personally performed the services described in this documentation, which was scribed  in my presence. The recorded information has been reviewed and is accurate.   Final Clinical Impressions(s) / ED Diagnoses   Final diagnoses:  Urticaria    New Prescriptions New Prescriptions   CETIRIZINE (ZYRTEC) 10 MG TABLET    Take 1 tablet (10 mg total) by mouth daily.   FAMOTIDINE (PEPCID) 20 MG TABLET    Take 1 tablet (20 mg total) by mouth 2 (two) times daily.   PREDNISONE (STERAPRED UNI-PAK 21 TAB) 10 MG (21) TBPK TABLET    Take 6 tabs by mouth daily  for 2 days, then 5 tabs for 2 days, then 4 tabs for 2 days, then 3 tabs for 2 days, 2 tabs for 2 days, then 1 tab by mouth daily for 2 days     Carlene Coria, Cordelia Poche 02/18/16 2139    Gwyneth Sprout, MD 02/18/16 2321

## 2016-02-18 NOTE — ED Triage Notes (Signed)
Pt in c/o hives x 3 week, no airway compromise. Pt alert, interactive, ambulatory in NAD.

## 2016-03-10 ENCOUNTER — Encounter (HOSPITAL_COMMUNITY): Payer: Self-pay

## 2016-03-10 ENCOUNTER — Emergency Department (HOSPITAL_COMMUNITY)
Admission: EM | Admit: 2016-03-10 | Discharge: 2016-03-11 | Disposition: A | Discharge status: Home / Self Care | Payer: Medicaid Other | Attending: Emergency Medicine | Admitting: Emergency Medicine

## 2016-03-10 DIAGNOSIS — L509 Urticaria, unspecified: Secondary | ICD-10-CM | POA: Insufficient documentation

## 2016-03-10 DIAGNOSIS — R3129 Other microscopic hematuria: Secondary | ICD-10-CM | POA: Insufficient documentation

## 2016-03-10 DIAGNOSIS — Z87891 Personal history of nicotine dependence: Secondary | ICD-10-CM | POA: Insufficient documentation

## 2016-03-10 LAB — URINALYSIS, ROUTINE W REFLEX MICROSCOPIC
Bilirubin Urine: NEGATIVE
Glucose, UA: NEGATIVE mg/dL
Ketones, ur: NEGATIVE mg/dL
NITRITE: NEGATIVE
PROTEIN: NEGATIVE mg/dL
Specific Gravity, Urine: 1.026 (ref 1.005–1.030)
pH: 5.5 (ref 5.0–8.0)

## 2016-03-10 LAB — POC URINE PREG, ED: Preg Test, Ur: NEGATIVE

## 2016-03-10 LAB — URINE MICROSCOPIC-ADD ON

## 2016-03-10 MED ORDER — PREDNISONE 20 MG PO TABS
60.0000 mg | ORAL_TABLET | Freq: Once | ORAL | Status: AC
  Administered 2016-03-10: 60 mg via ORAL
  Filled 2016-03-10: qty 3

## 2016-03-10 MED ORDER — FAMOTIDINE 20 MG PO TABS: 20.0000 mg | ORAL_TABLET | Freq: Two times a day (BID) | ORAL | 0 refills | Status: DC

## 2016-03-10 MED ORDER — FAMOTIDINE 20 MG PO TABS
20.0000 mg | ORAL_TABLET | Freq: Once | ORAL | Status: AC
  Administered 2016-03-10: 20 mg via ORAL
  Filled 2016-03-10: qty 1

## 2016-03-10 MED ORDER — LORATADINE 10 MG PO TABS: 10.0000 mg | ORAL_TABLET | Freq: Every day | ORAL | Status: DC

## 2016-03-10 MED ORDER — CETIRIZINE HCL 10 MG PO TABS: 10.0000 mg | ORAL_TABLET | Freq: Every day | ORAL | 0 refills | Status: DC

## 2016-03-10 MED ORDER — PREDNISONE 20 MG PO TABS: ORAL_TABLET | ORAL | 0 refills | Status: DC

## 2016-03-10 MED ORDER — EPINEPHRINE PF 1 MG/ML IJ SOLN
0.3000 mg | Freq: Once | INTRAMUSCULAR | Status: AC
  Administered 2016-03-10: 0.3 mg via SUBCUTANEOUS
  Filled 2016-03-10: qty 1

## 2016-03-10 NOTE — ED Provider Notes (Signed)
[facility] Provider Note   CSN: [id number] Arrival date & time: 03/10/16  2214     History   Chief Complaint Chief Complaint  Patient presents with  . Rash  . Flank Pain    BILATERAL    HPI Nancy Palmer is a 30 y.o. female.  She presents for evaluation of hives. Also states she has had some intermittent flank pain.  Patient has history of intermittent urticaria over the last several years. This been treated with any histamines and steroids with varying results. States at one point last year when I have seen her myself in the emergency room she was given subcutaneous epi and her symptoms resolved and did not recur for a month. That was the longest period of time she's had without hives during an outbreak. States she's now had symptoms again for a month. No difficult to breathing. Occasional "gastritis" described as an acid feeling in her stomach. Diffuse urticaria. No obvious antigen with review.  She has very frequency last week and drinks water it seemed improved and she has some pain in her flanks he hydrate herself some more this seemed to improve she never had gross hematuria, dysuria frequency or other symptoms.  HPI  Past Medical History:  Diagnosis Date  . Abnormal Pap smear   . H/O varicella   . HPV (human papilloma virus) anogenital infection   . Panic attack   . Smoker     Patient Active Problem List   Diagnosis Date Noted  . Variable fetal heart rate decelerations, antepartum 11/01/2013  . Status post normal vaginal delivery 11/01/2013    Past Surgical History:  Procedure Laterality Date  . CRYOTHERAPY  2008 cervix  . MANDIBLE SURGERY      OB History    Gravida Para Term Preterm AB Living   4 4 3 1   4    SAB TAB Ectopic Multiple Live Births           4       Home Medications    Prior to Admission medications   Medication Sig Start Date End Date Taking? Authorizing Provider  ibuprofen (ADVIL,MOTRIN) 200 MG tablet Take 800 mg by mouth  every 6 (six) hours as needed for moderate pain.   Yes Historical Provider, MD  amoxicillin-clavulanate (AUGMENTIN) 875-125 MG tablet Take 1 tablet by mouth every 12 (twelve) hours. Patient not taking: Reported on 03/10/2016 01/28/16   Ace GinsSerena Y Sam, PA-C  cetirizine (ZYRTEC) 10 MG tablet Take 1 tablet (10 mg total) by mouth daily. 1 po q day prn allergies 03/10/16   Rolland PorterMark Anuhea Gassner, MD  famotidine (PEPCID) 20 MG tablet Take 1 tablet (20 mg total) by mouth 2 (two) times daily. 03/10/16   Rolland PorterMark Addalyne Vandehei, MD  predniSONE (DELTASONE) 20 MG tablet 60 mg daily 2 days, 40 daily x4, 20 daily 4, 03/10/16   Rolland PorterMark Melvern Ramone, MD    Family History Family History  Problem Relation Age of Onset  . COPD Maternal Grandmother   . Diabetes Paternal Grandmother   . Heart disease Father   . COPD Paternal Grandfather   . Other Neg Hx     Social History Social History  Substance Use Topics  . Smoking status: Former Smoker    Quit date: 12/11/2010  . Smokeless tobacco: Never Used  . Alcohol use No     Allergies   Penicillins   Review of Systems Review of Systems  Constitutional: Negative for appetite change, chills, diaphoresis, fatigue and fever.  HENT:  Negative for mouth sores, sore throat and trouble swallowing.   Eyes: Negative for visual disturbance.  Respiratory: Negative for cough, chest tightness, shortness of breath and wheezing.   Cardiovascular: Negative for chest pain.  Gastrointestinal: Negative for abdominal distention, abdominal pain, diarrhea, nausea and vomiting.  Endocrine: Negative for polydipsia, polyphagia and polyuria.  Genitourinary: Positive for flank pain. Negative for dysuria, frequency and hematuria.  Musculoskeletal: Negative for gait problem.  Skin: Positive for rash. Negative for color change and pallor.  Neurological: Negative for dizziness, syncope, light-headedness and headaches.  Hematological: Does not bruise/bleed easily.  Psychiatric/Behavioral: Negative for behavioral  problems and confusion.     Physical Exam Updated Vital Signs BP 113/89 (BP Location: Left Arm)   Pulse 89   Temp 97.9 F (36.6 C) (Oral)   Resp 16   Ht 5\' 1"  (1.549 m)   Wt 185 lb (83.9 kg)   LMP 02/15/2016   SpO2 100%   BMI 34.96 kg/m   Physical Exam  Constitutional: She is oriented to person, place, and time. She appears well-developed and well-nourished. No distress.  HENT:  Head: Normocephalic.  Normal pharynx  Eyes: Conjunctivae are normal. Pupils are equal, round, and reactive to light. No scleral icterus.  Neck: Normal range of motion. Neck supple. No thyromegaly present.  Cardiovascular: Normal rate and regular rhythm.  Exam reveals no gallop and no friction rub.   No murmur heard. Pulmonary/Chest: Effort normal and breath sounds normal. No respiratory distress. She has no wheezes. She has no rales.  Clear lungs  Abdominal: Soft. Bowel sounds are normal. She exhibits no distension. There is no tenderness. There is no rebound.  Musculoskeletal: Normal range of motion.  Neurological: She is alert and oriented to person, place, and time.  Skin: Skin is warm and dry. No rash noted.  Diffuse urticaria.  Psychiatric: She has a normal mood and affect. Her behavior is normal.     ED Treatments / Results  Labs (all labs ordered are listed, but only abnormal results are displayed) Labs Reviewed  URINALYSIS, ROUTINE W REFLEX MICROSCOPIC (NOT AT [facility]) - Abnormal; Notable for the following:       Result Value   APPearance CLOUDY (*)    Hgb urine dipstick LARGE (*)    Leukocytes, UA SMALL (*)    All other components within normal limits  URINE MICROSCOPIC-ADD ON - Abnormal; Notable for the following:    Squamous Epithelial / LPF 0-5 (*)    Bacteria, UA FEW (*)    All other components within normal limits  POC URINE PREG, ED    EKG  EKG Interpretation None       Radiology No results found.  Procedures Procedures (including critical care time)  Medications  Ordered in ED Medications  loratadine (CLARITIN) tablet 10 mg (not administered)  predniSONE (DELTASONE) tablet 60 mg (60 mg Oral Given 03/10/16 2321)  famotidine (PEPCID) tablet 20 mg (20 mg Oral Given 03/10/16 2321)  EPINEPHrine (ADRENALIN) 0.3 mg (0.3 mg Subcutaneous Given 03/10/16 2321)     Initial Impression / Assessment and Plan / ED Course  I have reviewed the triage vital signs and the nursing notes.  Pertinent labs & imaging results that were available during my care of the patient were reviewed by me and considered in my medical decision making (see chart for details).  Clinical Course    Scopic hematuria. Not currently demonstrating. No current symptoms. We did discuss this could possibly her present renal stones with intermittent painless hematuria  and may have passed a stone last week. No indication for imaging currently.  Encourage her to follow-up with primary care to ensure the hematuria resolves. If not may need some outpatient imaging as well. Given subcutaneous epi here. Also given by mouth prednisone, Zyrtec, and upset. She is appropriate for discharge home. She states she "gets insurance" to her new job in about 5 or 6 days and plans on seeing allergy. Given referral to [facility]. Per prescription for 10 day taper of prednisone, daily Zyrtec, Pepcid.  Final Clinical Impressions(s) / ED Diagnoses   Final diagnoses:  Urticaria  Other microscopic hematuria    New Prescriptions New Prescriptions   CETIRIZINE (ZYRTEC) 10 MG TABLET    Take 1 tablet (10 mg total) by mouth daily. 1 po q day prn allergies   FAMOTIDINE (PEPCID) 20 MG TABLET    Take 1 tablet (20 mg total) by mouth 2 (two) times daily.   PREDNISONE (DELTASONE) 20 MG TABLET    60 mg daily 2 days, 40 daily x4, 20 daily 4,     Rolland PorterMark Kamryn Messineo, MD 03/10/16 2342

## 2016-03-10 NOTE — Discharge Instructions (Signed)
Call for outpatient appointment with allergist as soon as possible

## 2016-03-10 NOTE — ED Triage Notes (Addendum)
PT C/O AN ITCHY, BLISTERED RASH ALL OVER HER BODY X2 MONTHS. PT STS SHE WAS GIVEN STEROIDS A MONTH AGO WITH NO RELIEF. UNKNOWN CAUSE OF SYMPTOMS. PT ALSO C/O FREQUENT URINATION AND BILATERAL FLANK PAIN ON AND OFF X2 WEEKS.

## 2016-06-02 ENCOUNTER — Emergency Department (HOSPITAL_COMMUNITY): Payer: Self-pay

## 2016-06-02 ENCOUNTER — Emergency Department (HOSPITAL_BASED_OUTPATIENT_CLINIC_OR_DEPARTMENT_OTHER)
Admission: EM | Admit: 2016-06-02 | Discharge: 2016-06-02 | Discharge status: Absent without leave | Payer: Medicaid Other

## 2016-06-02 ENCOUNTER — Emergency Department (HOSPITAL_COMMUNITY)
Admission: EM | Admit: 2016-06-02 | Discharge: 2016-06-02 | Disposition: A | Discharge status: Home / Self Care | Payer: Self-pay | Attending: Emergency Medicine | Admitting: Emergency Medicine

## 2016-06-02 ENCOUNTER — Encounter (HOSPITAL_COMMUNITY): Payer: Self-pay | Admitting: Emergency Medicine

## 2016-06-02 DIAGNOSIS — Z87891 Personal history of nicotine dependence: Secondary | ICD-10-CM | POA: Insufficient documentation

## 2016-06-02 DIAGNOSIS — N201 Calculus of ureter: Secondary | ICD-10-CM

## 2016-06-02 DIAGNOSIS — N23 Unspecified renal colic: Secondary | ICD-10-CM

## 2016-06-02 DIAGNOSIS — N132 Hydronephrosis with renal and ureteral calculous obstruction: Secondary | ICD-10-CM | POA: Insufficient documentation

## 2016-06-02 LAB — CBC
HEMATOCRIT: 37.1 % (ref 36.0–46.0)
Hemoglobin: 12.5 g/dL (ref 12.0–15.0)
MCH: 29.9 pg (ref 26.0–34.0)
MCHC: 33.7 g/dL (ref 30.0–36.0)
MCV: 88.8 fL (ref 78.0–100.0)
Platelets: 185 10*3/uL (ref 150–400)
RBC: 4.18 MIL/uL (ref 3.87–5.11)
RDW: 13.3 % (ref 11.5–15.5)
WBC: 15.7 10*3/uL — ABNORMAL HIGH (ref 4.0–10.5)

## 2016-06-02 LAB — COMPREHENSIVE METABOLIC PANEL
ALBUMIN: 4.2 g/dL (ref 3.5–5.0)
ALK PHOS: 69 U/L (ref 38–126)
ALT: 11 U/L — ABNORMAL LOW (ref 14–54)
AST: 14 U/L — AB (ref 15–41)
Anion gap: 7 (ref 5–15)
BILIRUBIN TOTAL: 0.5 mg/dL (ref 0.3–1.2)
BUN: 12 mg/dL (ref 6–20)
CO2: 23 mmol/L (ref 22–32)
Calcium: 8.7 mg/dL — ABNORMAL LOW (ref 8.9–10.3)
Chloride: 109 mmol/L (ref 101–111)
Creatinine, Ser: 0.95 mg/dL (ref 0.44–1.00)
GFR calc Af Amer: >60 mL/min (ref >60)
GFR calc non Af Amer: >60 mL/min (ref >60)
GLUCOSE: 105 mg/dL — AB (ref 65–99)
POTASSIUM: 3.4 mmol/L — AB (ref 3.5–5.1)
SODIUM: 139 mmol/L (ref 135–145)
TOTAL PROTEIN: 6.9 g/dL (ref 6.5–8.1)

## 2016-06-02 LAB — URINALYSIS, ROUTINE W REFLEX MICROSCOPIC
Bilirubin Urine: NEGATIVE
GLUCOSE, UA: NEGATIVE mg/dL
Ketones, ur: NEGATIVE mg/dL
Leukocytes, UA: NEGATIVE
Nitrite: NEGATIVE
PH: 6 (ref 5.0–8.0)
PROTEIN: NEGATIVE mg/dL
Specific Gravity, Urine: 1.017 (ref 1.005–1.030)

## 2016-06-02 LAB — POC URINE PREG, ED: Preg Test, Ur: NEGATIVE

## 2016-06-02 MED ORDER — OXYCODONE-ACETAMINOPHEN 5-325 MG PO TABS: 2.0000 | ORAL_TABLET | ORAL | 0 refills | Status: DC | PRN

## 2016-06-02 MED ORDER — FENTANYL CITRATE (PF) 100 MCG/2ML IJ SOLN
50.0000 ug | Freq: Once | INTRAMUSCULAR | Status: AC
  Administered 2016-06-02: 50 ug via INTRAVENOUS
  Filled 2016-06-02: qty 2

## 2016-06-02 MED ORDER — TAMSULOSIN HCL 0.4 MG PO CAPS: 0.4000 mg | ORAL_CAPSULE | Freq: Every day | ORAL | 0 refills | Status: DC

## 2016-06-02 MED ORDER — ONDANSETRON HCL 4 MG/2ML IJ SOLN
4.0000 mg | Freq: Once | INTRAMUSCULAR | Status: AC | PRN
  Administered 2016-06-02: 4 mg via INTRAVENOUS
  Filled 2016-06-02: qty 2

## 2016-06-02 MED ORDER — FENTANYL CITRATE (PF) 100 MCG/2ML IJ SOLN
50.0000 ug | INTRAMUSCULAR | Status: DC | PRN
  Administered 2016-06-02: 50 ug via INTRAVENOUS
  Filled 2016-06-02: qty 2

## 2016-06-02 MED ORDER — KETOROLAC TROMETHAMINE 30 MG/ML IJ SOLN
30.0000 mg | Freq: Once | INTRAMUSCULAR | Status: AC
  Administered 2016-06-02: 30 mg via INTRAVENOUS
  Filled 2016-06-02: qty 1

## 2016-06-02 NOTE — ED Provider Notes (Signed)
[facility] Provider Note   CSN: [id number] Arrival date & time: 06/02/16  1657     History   Chief Complaint Chief Complaint  Patient presents with  . Nephrolithiasis    HPI Nancy Palmer is a 31 y.o. female.  The history is provided by the patient. No language interpreter was used.  Abdominal Pain   This is a new problem. The problem occurs constantly. The problem has been gradually worsening. The pain is located in the RLQ. The pain is moderate. Associated symptoms include nausea. Pertinent negatives include anorexia. Nothing relieves the symptoms. Past workup includes CT scan.  Pt reports she was diagnosed with a kidney stone 12/17.  Pt reports pain resolved but now increased severe pain.  Pt reports she was told she would pass the stone  Past Medical History:  Diagnosis Date  . Abnormal Pap smear   . H/O varicella   . HPV (human papilloma virus) anogenital infection   . Panic attack   . Smoker     Patient Active Problem List   Diagnosis Date Noted  . Variable fetal heart rate decelerations, antepartum 11/01/2013  . Status post normal vaginal delivery 11/01/2013    Past Surgical History:  Procedure Laterality Date  . CRYOTHERAPY  2008 cervix  . MANDIBLE SURGERY      OB History    Gravida Para Term Preterm AB Living   4 4 3 1   4    SAB TAB Ectopic Multiple Live Births           4       Home Medications    Prior to Admission medications   Medication Sig Start Date End Date Taking? Authorizing Provider  amoxicillin-clavulanate (AUGMENTIN) 875-125 MG tablet Take 1 tablet by mouth every 12 (twelve) hours. Patient not taking: Reported on 03/10/2016 01/28/16   Ace GinsSerena Y Sam, PA-C  cetirizine (ZYRTEC) 10 MG tablet Take 1 tablet (10 mg total) by mouth daily. 1 po q day prn allergies 03/10/16   Rolland PorterMark James, MD  famotidine (PEPCID) 20 MG tablet Take 1 tablet (20 mg total) by mouth 2 (two) times daily. 03/10/16   Rolland PorterMark James, MD  ibuprofen (ADVIL,MOTRIN) 200  MG tablet Take 800 mg by mouth every 6 (six) hours as needed for moderate pain.    Historical Provider, MD  predniSONE (DELTASONE) 20 MG tablet 60 mg daily 2 days, 40 daily x4, 20 daily 4, 03/10/16   Rolland PorterMark James, MD    Family History Family History  Problem Relation Age of Onset  . COPD Maternal Grandmother   . Diabetes Paternal Grandmother   . Heart disease Father   . COPD Paternal Grandfather   . Other Neg Hx     Social History Social History  Substance Use Topics  . Smoking status: Former Smoker    Quit date: 12/11/2010  . Smokeless tobacco: Never Used  . Alcohol use No     Allergies   Penicillins   Review of Systems Review of Systems  Gastrointestinal: Positive for abdominal pain and nausea. Negative for anorexia.  All other systems reviewed and are negative.    Physical Exam Updated Vital Signs BP 125/85   Pulse 83   Temp 98.7 F (37.1 C)   Resp 18   LMP 05/13/2016   SpO2 100%   Physical Exam  Constitutional: She is oriented to person, place, and time. She appears well-developed and well-nourished.  HENT:  Head: Normocephalic.  Eyes: EOM are normal.  Neck: Normal range  of motion.  Pulmonary/Chest: Effort normal.  Abdominal: Soft. Bowel sounds are normal. She exhibits no distension. There is no tenderness.  Musculoskeletal: Normal range of motion.  Neurological: She is alert and oriented to person, place, and time.  Psychiatric: She has a normal mood and affect.  Nursing note and vitals reviewed.    ED Treatments / Results  Labs (all labs ordered are listed, but only abnormal results are displayed) Labs Reviewed  URINALYSIS, ROUTINE W REFLEX MICROSCOPIC - Abnormal; Notable for the following:       Result Value   APPearance HAZY (*)    Hgb urine dipstick LARGE (*)    Bacteria, UA RARE (*)    Squamous Epithelial / LPF 0-5 (*)    All other components within normal limits  COMPREHENSIVE METABOLIC PANEL - Abnormal; Notable for the following:     Potassium 3.4 (*)    Glucose, Bld 105 (*)    Calcium 8.7 (*)    AST 14 (*)    ALT 11 (*)    All other components within normal limits  CBC - Abnormal; Notable for the following:    WBC 15.7 (*)    All other components within normal limits  POC URINE PREG, ED    EKG  EKG Interpretation None       Radiology Ct Renal Stone Study  Result Date: 06/02/2016 CLINICAL DATA:  Right flank pain.  History of kidney stones. EXAM: CT ABDOMEN AND PELVIS WITHOUT CONTRAST TECHNIQUE: Multidetector CT imaging of the abdomen and pelvis was performed following the standard protocol without IV contrast. COMPARISON:  04/28/2016 FINDINGS: Lower chest: The visualized lung bases are clear. Hepatobiliary: No focal liver abnormality is seen. Status post cholecystectomy. No biliary dilatation. Pancreas: Unremarkable. Spleen: Unremarkable. Adrenals/Urinary Tract: Unremarkable adrenal glands. There is persistent moderate right hydroureteronephrosis due to 1 or 2 adjacent distal ureteral calculi measuring approximately 5 mm in aggregate. These calculi have slightly migrated distally enter now at the UVJ. The left kidney is unremarkable. The bladder is unremarkable. Stomach/Bowel: The stomach is within normal limits. No evidence of bowel obstruction or inflammation. Unremarkable appendix. Vascular/Lymphatic: Normal caliber of the abdominal aorta. No enlarged lymph nodes. Reproductive: Uterus and bilateral adnexa are unremarkable. Other: No intraperitoneal free fluid. No abdominal wall mass or hernia. Musculoskeletal: No acute osseous abnormality. IMPRESSION: Slight progression of distal right ureteral calculi to the UVJ with persistent moderate hydroureteronephrosis. Electronically Signed   By: Sebastian Ache M.D.   On: 06/02/2016 20:11    Procedures Procedures (including critical care time)  Medications Ordered in ED Medications  fentaNYL (SUBLIMAZE) injection 50 mcg (50 mcg Intravenous Given 06/02/16 1835)  ondansetron  (ZOFRAN) injection 4 mg (4 mg Intravenous Given 06/02/16 1834)  fentaNYL (SUBLIMAZE) injection 50 mcg (50 mcg Intravenous Given 06/02/16 1916)     Initial Impression / Assessment and Plan / ED Course  I have reviewed the triage vital signs and the nursing notes.  Pertinent labs & imaging results that were available during my care of the patient were reviewed by me and considered in my medical decision making (see chart for details).     Pt has right hydronephrosis and 5mm ruvj stone.  Pt reports some relief with fentanyl x 2.  Pt reports took oxycodone at home with no relief.   Pt given rx for flomax, oxycodone.  Pt advised to see [facility] for evaluation   Final Clinical Impressions(s) / ED Diagnoses   Final diagnoses:  Renal colic on right side  Ureterolithiasis    New Prescriptions New Prescriptions   OXYCODONE-ACETAMINOPHEN (PERCOCET/ROXICET) 5-325 MG TABLET    Take 2 tablets by mouth every 4 (four) hours as needed for severe pain.   TAMSULOSIN (FLOMAX) 0.4 MG CAPS CAPSULE    Take 1 capsule (0.4 mg total) by mouth daily.  Pt advised of movement of stone.  Pt advised she needs to see Urology for evaluation.     Lonia Skinner Burke, PA-C 06/02/16 2028    Linwood Dibbles, MD 06/05/16 2022

## 2016-06-02 NOTE — ED Triage Notes (Signed)
Patient states that she was told couple months ago that she had two kidney stones and they should be small enough to pass.  Patient has been having right flank pain since 10 am today and painful urination.

## 2016-06-02 NOTE — Discharge Instructions (Signed)
Schedule to see the Urologist for evaluation  

## 2016-06-02 NOTE — ED Notes (Signed)
Bed: [facility] Expected date:  Expected time:  Means of arrival:  Comments: 

## 2017-10-09 IMAGING — CT CT RENAL STONE PROTOCOL
2 of 3 series · 16 of 46 positions shown, 18 images · non-contrast
Comparison: 04/28/2016

CLINICAL DATA: Right flank pain.  History of kidney stones.

EXAM:
CT ABDOMEN AND PELVIS WITHOUT CONTRAST
TECHNIQUE: Multidetector CT imaging of the abdomen and pelvis was performed
following the standard protocol without IV contrast.

[Series 3: coronal · coronal · 0.74mm/px · 3 of 139 slices shown]
[im 47/139  soft-tissue]
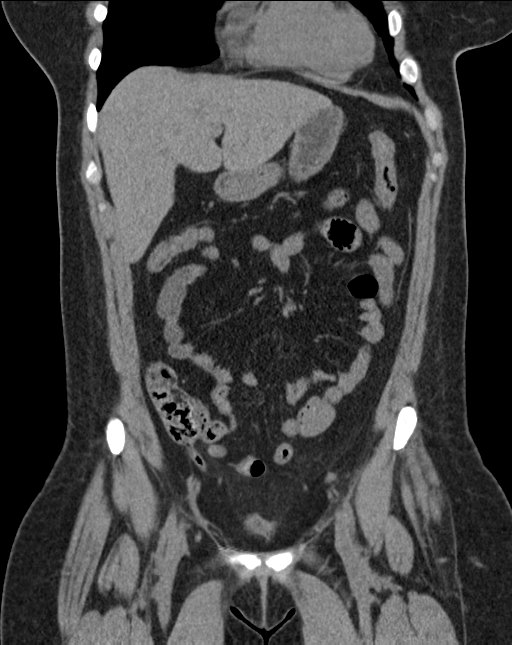
[im 62/139  soft-tissue]
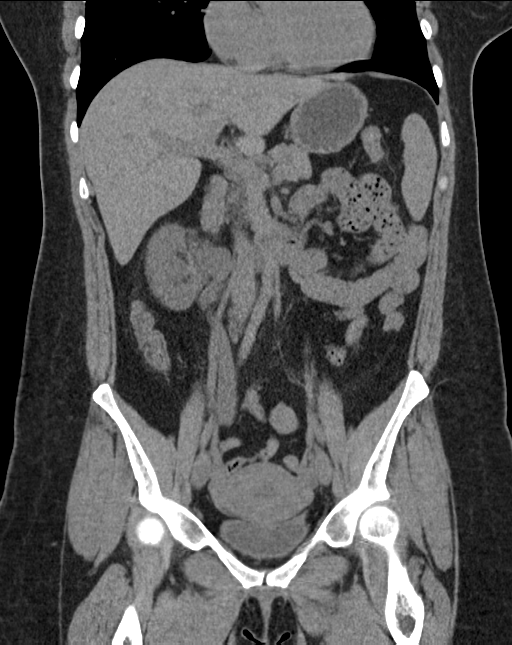
[im 77/139  soft-tissue]
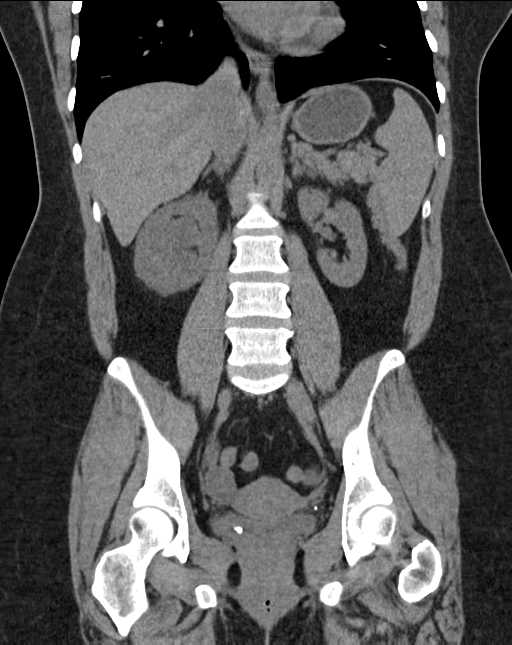

[Series 6: lung · axial · 0.80mm/px · z∈[+1370,+1506]mm · 13 of 80 slices shown, 15 images]
[im 6/80  soft-tissue]
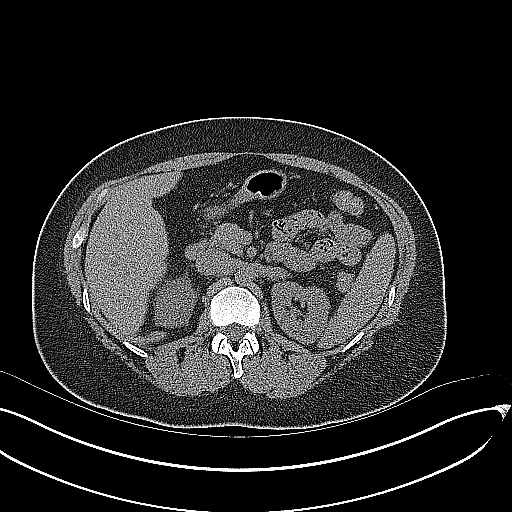
[im 6/80  bone]
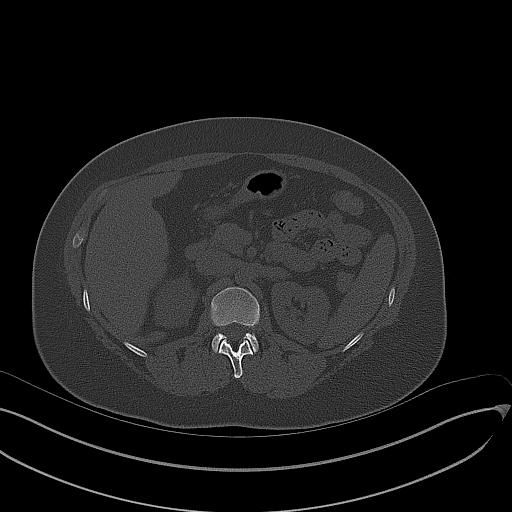
[im 11/80  soft-tissue]
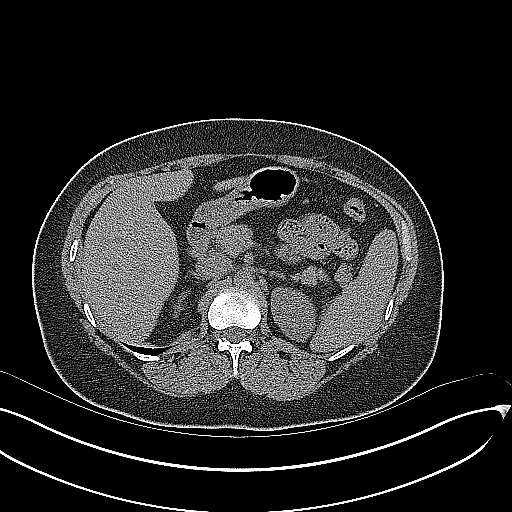
[im 16/80  soft-tissue]
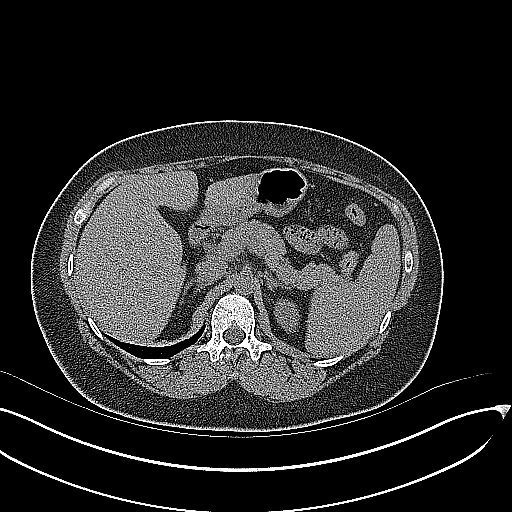
[im 23/80  soft-tissue]
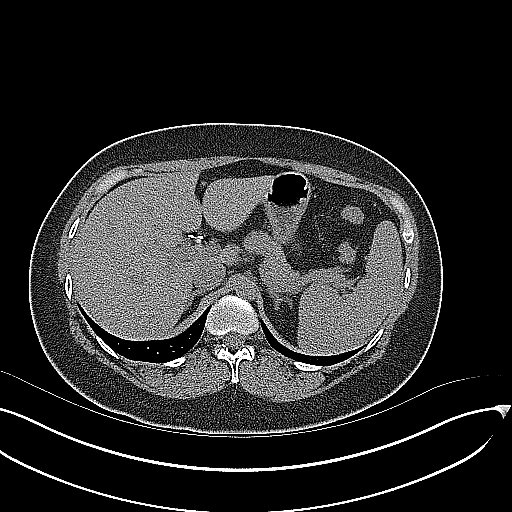
[im 29/80  soft-tissue]
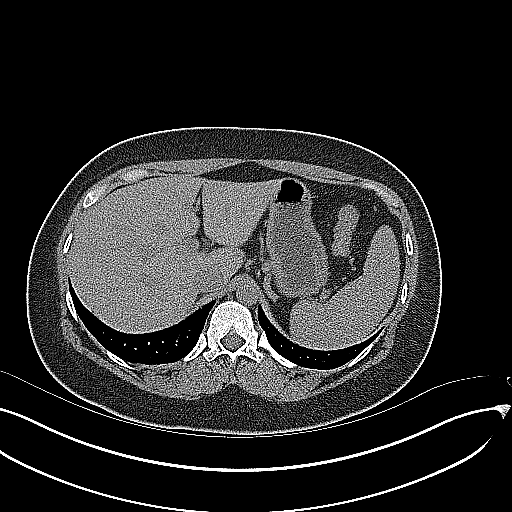
[im 34/80  soft-tissue]
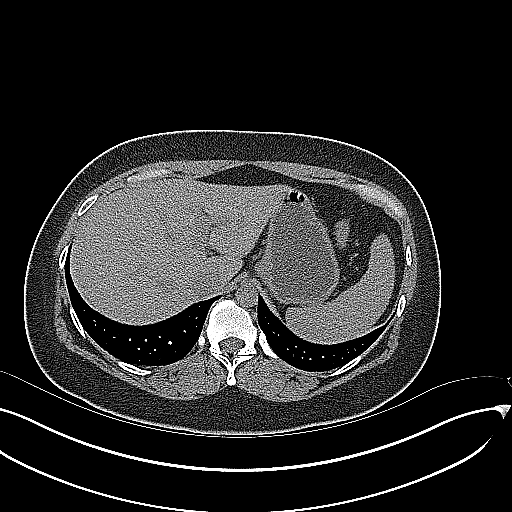
[im 41/80  soft-tissue]
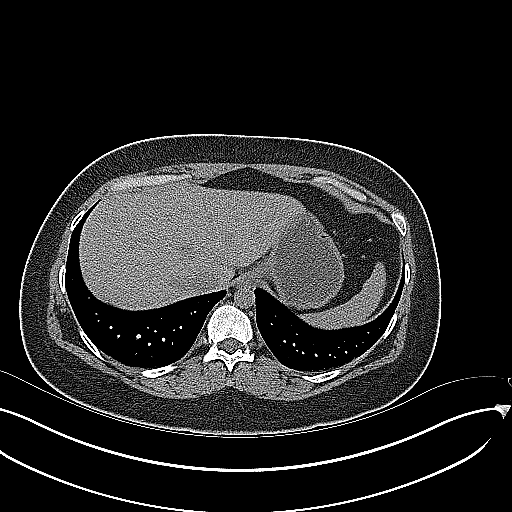
[im 46/80  soft-tissue]
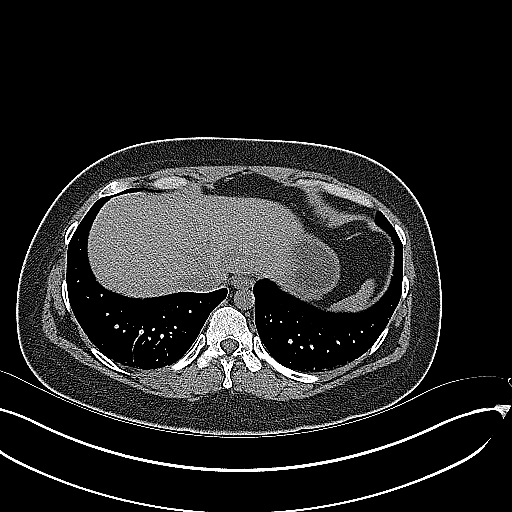
[im 51/80  soft-tissue]
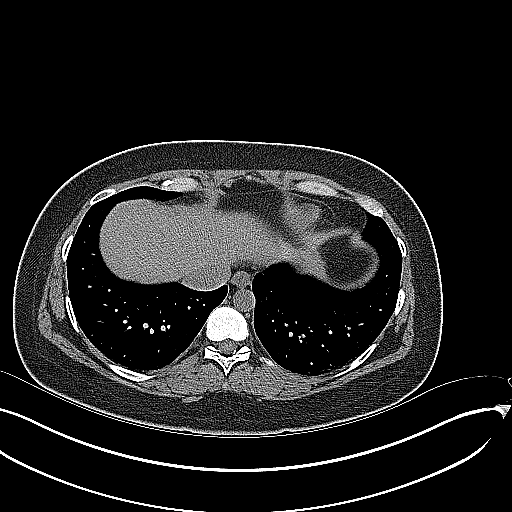
[im 51/80  bone]
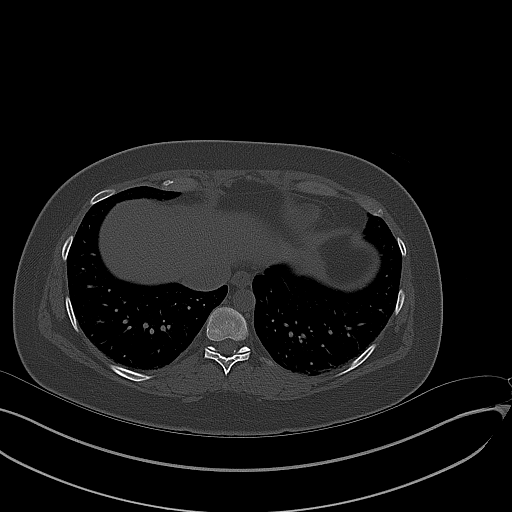
[im 57/80  soft-tissue]
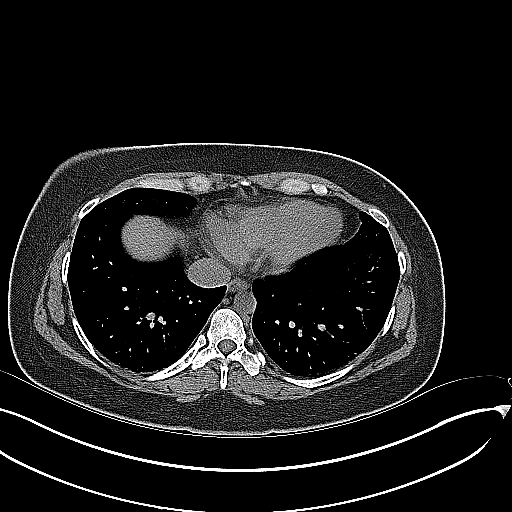
[im 64/80  soft-tissue]
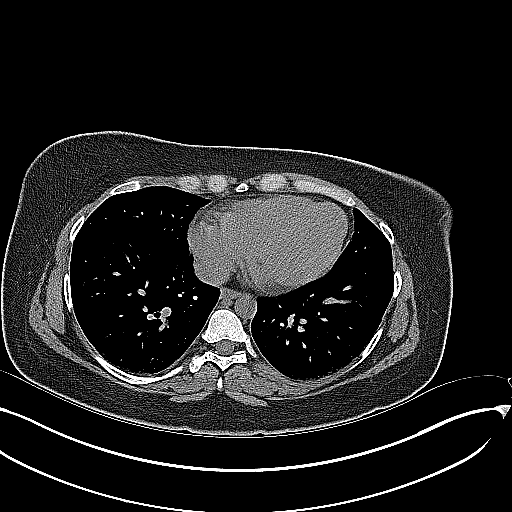
[im 69/80  soft-tissue]
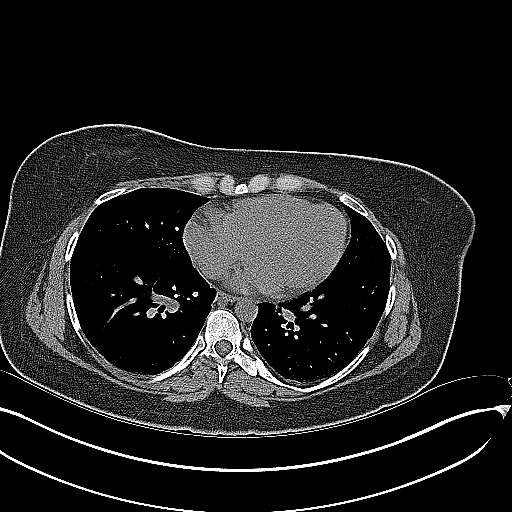
[im 74/80  soft-tissue]
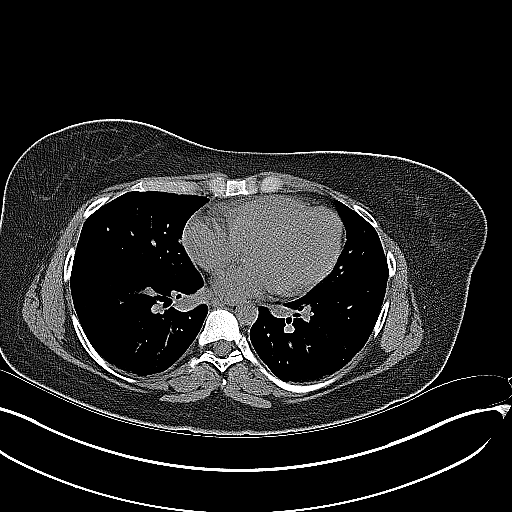

[16 of 46 positions shown; findings below may reference images not displayed]

FINDINGS: Lower chest: The visualized lung bases are clear.

Hepatobiliary: No focal liver abnormality is seen. Status post
cholecystectomy. No biliary dilatation.

Pancreas: Unremarkable.

Spleen: Unremarkable.

Adrenals/Urinary Tract: Unremarkable adrenal glands. There is
persistent moderate right hydroureteronephrosis due to 1 or 2
adjacent distal ureteral calculi measuring approximately 5 mm in
aggregate. These calculi have slightly migrated distally enter now
at the UVJ. The left kidney is unremarkable. The bladder is
unremarkable.

Stomach/Bowel: The stomach is within normal limits. No evidence of
bowel obstruction or inflammation. Unremarkable appendix.

Vascular/Lymphatic: Normal caliber of the abdominal aorta. No
enlarged lymph nodes.

Reproductive: Uterus and bilateral adnexa are unremarkable.

Other: No intraperitoneal free fluid. No abdominal wall mass or
hernia.

Musculoskeletal: No acute osseous abnormality.
IMPRESSION: Slight progression of distal right ureteral calculi to the UVJ with
persistent moderate hydroureteronephrosis.

## 2020-12-18 ENCOUNTER — Ambulatory Visit
Admission: EM | Admit: 2020-12-18 | Discharge: 2020-12-18 | Disposition: A | Discharge status: Home / Self Care | Payer: 59 | Attending: Family Medicine | Admitting: Family Medicine

## 2020-12-18 ENCOUNTER — Ambulatory Visit (HOSPITAL_BASED_OUTPATIENT_CLINIC_OR_DEPARTMENT_OTHER)
Admit: 2020-12-18 | Discharge: 2020-12-18 | Disposition: A | Discharge status: Home / Self Care | Payer: 59 | Attending: Family Medicine | Admitting: Family Medicine

## 2020-12-18 ENCOUNTER — Other Ambulatory Visit: Payer: Self-pay

## 2020-12-18 DIAGNOSIS — M25572 Pain in left ankle and joints of left foot: Secondary | ICD-10-CM

## 2020-12-18 DIAGNOSIS — M79672 Pain in left foot: Secondary | ICD-10-CM | POA: Diagnosis not present

## 2020-12-18 NOTE — Discharge Instructions (Addendum)
Weight bear as tolerated May try Aleve or ibuprofen daily

## 2020-12-18 NOTE — ED Provider Notes (Signed)
[facility]    CSN: [id number] Arrival date & time: 12/18/20  1123      History   Chief Complaint Chief Complaint  Patient presents with   Foot Pain    HPI Nancy Palmer is a 35 y.o. female.  Complains of pain in left foot, primarily on the dorsum of the foot.  It does not hurt when she is not weightbearing.  She stands on her feet 12 to 14 hours a day at work.  About 1 month ago she ran over her foot with a riding lawnmower.  Pain was not as bad then as now.  There is been minimal swelling.  HPI  Past Medical History:  Diagnosis Date   Abnormal Pap smear    H/O varicella    HPV (human papilloma virus) anogenital infection    Panic attack    Smoker     Patient Active Problem List   Diagnosis Date Noted   Variable fetal heart rate decelerations, antepartum 11/01/2013   Status post normal vaginal delivery 11/01/2013    Past Surgical History:  Procedure Laterality Date   CRYOTHERAPY  2008 cervix   MANDIBLE SURGERY      OB History     Gravida  4   Para  4   Term  3   Preterm  1   AB      Living  4      SAB      IAB      Ectopic      Multiple      Live Births  4            Home Medications    Prior to Admission medications   Medication Sig Start Date End Date Taking? Authorizing Provider  amoxicillin-clavulanate (AUGMENTIN) 875-125 MG tablet Take 1 tablet by mouth every 12 (twelve) hours. Patient not taking: Reported on 03/10/2016 01/28/16   Sam, Ace Gins, PA-C  cetirizine (ZYRTEC) 10 MG tablet Take 1 tablet (10 mg total) by mouth daily. 1 po q day prn allergies Patient not taking: Reported on 06/02/2016 03/10/16   Rolland Porter, MD  diphenhydrAMINE (BENADRYL) 25 MG tablet Take 25 mg by mouth daily as needed (hives).    [provider]  famotidine (PEPCID) 20 MG tablet Take 1 tablet (20 mg total) by mouth 2 (two) times daily. Patient not taking: Reported on 06/02/2016 03/10/16   Rolland Porter, MD  oxyCODONE-acetaminophen  (PERCOCET/ROXICET) 5-325 MG tablet Take 2 tablets by mouth every 4 (four) hours as needed for severe pain. 06/02/16   Elson Areas, PA-C  predniSONE (DELTASONE) 20 MG tablet 60 mg daily 2 days, 40 daily x4, 20 daily 4, Patient not taking: Reported on 06/02/2016 03/10/16   Rolland Porter, MD  tamsulosin (FLOMAX) 0.4 MG CAPS capsule Take 1 capsule (0.4 mg total) by mouth daily. 06/02/16   Elson Areas, PA-C    Family History Family History  Problem Relation Age of Onset   COPD Maternal Grandmother    Diabetes Paternal Grandmother    Heart disease Father    COPD Paternal Grandfather    Other Neg Hx     Social History Social History   Tobacco Use   Smoking status: Former    Types: Cigarettes    Quit date: 12/11/2010    Years since quitting: 10.0   Smokeless tobacco: Never  Substance Use Topics   Alcohol use: No   Drug use: No     Allergies  Penicillins   Review of Systems Review of Systems  Musculoskeletal:  Positive for joint swelling.  All other systems reviewed and are negative.   Physical Exam Triage Vital Signs ED Triage Vitals  Enc Vitals Group     BP 12/18/20 1136 121/85     Pulse Rate 12/18/20 1136 70     Resp 12/18/20 1136 18     Temp 12/18/20 1136 97.9 F (36.6 C)     Temp Source 12/18/20 1136 Oral     SpO2 12/18/20 1136 97 %     Weight --      Height --      Head Circumference --      Peak Flow --      Pain Score 12/18/20 1134 7     Pain Loc --      Pain Edu? --      Excl. in GC? --    No data found.  Updated Vital Signs BP 121/85 (BP Location: Left Arm)   Pulse 70   Temp 97.9 F (36.6 C) (Oral)   Resp 18   LMP 12/11/2020 (Approximate)   SpO2 97%   Visual Acuity Right Eye Distance:   Left Eye Distance:   Bilateral Distance:    Right Eye Near:   Left Eye Near:    Bilateral Near:     Physical Exam Vitals and nursing note reviewed.  Constitutional:      Appearance: Normal appearance.  Cardiovascular:     Rate and Rhythm:  Normal rate.  Pulmonary:     Effort: Pulmonary effort is normal.  Musculoskeletal:     Comments: Left foot: Minimal tenderness with palpation.  Can feel no lump or mass such as neuroma.  There is no tenderness on the plantar side of her foot to suggest plantar fasciitis or heel spur.  There seems to be pain with movement against lateral resistance suggesting possible ligamentous injury to the talar fibular ligaments  Neurological:     General: No focal deficit present.     Mental Status: She is alert and oriented to person, place, and time.     UC Treatments / Results  Labs (all labs ordered are listed, but only abnormal results are displayed) Labs Reviewed - No data to display  EKG   Radiology No results found.  Procedures Procedures (including critical care time)  Medications Ordered in UC Medications - No data to display  Initial Impression / Assessment and Plan / UC Course  I have reviewed the triage vital signs and the nursing notes.  Pertinent labs & imaging results that were available during my care of the patient were reviewed by me and considered in my medical decision making (see chart for details).     Foot pain, possible lateral sprain.  Will send for x-rays.  Have also recommended weightbearing as tolerated and Aleve or ibuprofen Final Clinical Impressions(s) / UC Diagnoses   Final diagnoses:  Pain of joint of left ankle and foot     Discharge Instructions      Weight bear as tolerated May try Aleve or ibuprofen daily   ED Prescriptions   None    PDMP not reviewed this encounter.   Frederica Kuster, MD 12/18/20 1236

## 2020-12-18 NOTE — ED Triage Notes (Signed)
Pt states she ran over her left foot with lawn mower about a month ago and is on her feet a lot at work. Pt c/o pain today, pt can flex foot and is ambulating on it.

## 2021-05-27 DIAGNOSIS — J00 Acute nasopharyngitis [common cold]: Secondary | ICD-10-CM | POA: Diagnosis not present

## 2021-05-27 DIAGNOSIS — Z20822 Contact with and (suspected) exposure to covid-19: Secondary | ICD-10-CM | POA: Diagnosis not present

## 2021-05-27 DIAGNOSIS — Z03818 Encounter for observation for suspected exposure to other biological agents ruled out: Secondary | ICD-10-CM | POA: Diagnosis not present

## 2021-07-31 DIAGNOSIS — Z01419 Encounter for gynecological examination (general) (routine) without abnormal findings: Secondary | ICD-10-CM | POA: Diagnosis not present

## 2021-07-31 DIAGNOSIS — Z124 Encounter for screening for malignant neoplasm of cervix: Secondary | ICD-10-CM | POA: Diagnosis not present

## 2021-07-31 DIAGNOSIS — N920 Excessive and frequent menstruation with regular cycle: Secondary | ICD-10-CM | POA: Diagnosis not present

## 2021-08-20 DIAGNOSIS — N920 Excessive and frequent menstruation with regular cycle: Secondary | ICD-10-CM | POA: Diagnosis not present

## 2022-05-15 DIAGNOSIS — R399 Unspecified symptoms and signs involving the genitourinary system: Secondary | ICD-10-CM | POA: Diagnosis not present

## 2022-06-23 ENCOUNTER — Emergency Department (HOSPITAL_BASED_OUTPATIENT_CLINIC_OR_DEPARTMENT_OTHER): Payer: Commercial Managed Care - PPO

## 2022-06-23 ENCOUNTER — Emergency Department (HOSPITAL_BASED_OUTPATIENT_CLINIC_OR_DEPARTMENT_OTHER)
Admission: EM | Admit: 2022-06-23 | Discharge: 2022-06-23 | Disposition: A | Discharge status: Home / Self Care | Payer: Commercial Managed Care - PPO | Attending: Emergency Medicine | Admitting: Emergency Medicine

## 2022-06-23 ENCOUNTER — Other Ambulatory Visit: Payer: Self-pay

## 2022-06-23 DIAGNOSIS — R1012 Left upper quadrant pain: Secondary | ICD-10-CM | POA: Insufficient documentation

## 2022-06-23 DIAGNOSIS — Z87891 Personal history of nicotine dependence: Secondary | ICD-10-CM | POA: Diagnosis not present

## 2022-06-23 LAB — CBC
HCT: 32.3 % — ABNORMAL LOW (ref 36.0–46.0)
Hemoglobin: 10.4 g/dL — ABNORMAL LOW (ref 12.0–15.0)
MCH: 26.7 pg (ref 26.0–34.0)
MCHC: 32.2 g/dL (ref 30.0–36.0)
MCV: 82.8 fL (ref 80.0–100.0)
Platelets: 197 10*3/uL (ref 150–400)
RBC: 3.9 MIL/uL (ref 3.87–5.11)
RDW: 14.7 % (ref 11.5–15.5)
WBC: 9.4 10*3/uL (ref 4.0–10.5)
nRBC: 0 % (ref 0.0–0.2)

## 2022-06-23 LAB — COMPREHENSIVE METABOLIC PANEL
ALT: 10 U/L (ref 0–44)
AST: 11 U/L — ABNORMAL LOW (ref 15–41)
Albumin: 4 g/dL (ref 3.5–5.0)
Alkaline Phosphatase: 73 U/L (ref 38–126)
Anion gap: 8 (ref 5–15)
BUN: 16 mg/dL (ref 6–20)
CO2: 23 mmol/L (ref 22–32)
Calcium: 8.7 mg/dL — ABNORMAL LOW (ref 8.9–10.3)
Chloride: 107 mmol/L (ref 98–111)
Creatinine, Ser: 0.96 mg/dL (ref 0.44–1.00)
GFR, Estimated: >60 mL/min (ref >60)
Glucose, Bld: 86 mg/dL (ref 70–99)
Potassium: 3.5 mmol/L (ref 3.5–5.1)
Sodium: 138 mmol/L (ref 135–145)
Total Bilirubin: 0.3 mg/dL (ref 0.3–1.2)
Total Protein: 7.1 g/dL (ref 6.5–8.1)

## 2022-06-23 LAB — URINALYSIS, ROUTINE W REFLEX MICROSCOPIC
Bilirubin Urine: NEGATIVE
Glucose, UA: NEGATIVE mg/dL
Hgb urine dipstick: NEGATIVE
Ketones, ur: NEGATIVE mg/dL
Leukocytes,Ua: NEGATIVE
Nitrite: NEGATIVE
Protein, ur: NEGATIVE mg/dL
Specific Gravity, Urine: 1.015 (ref 1.005–1.030)
pH: 5.5 (ref 5.0–8.0)

## 2022-06-23 LAB — LIPASE, BLOOD: Lipase: 20 U/L (ref 11–51)

## 2022-06-23 LAB — PREGNANCY, URINE: Preg Test, Ur: NEGATIVE

## 2022-06-23 MED ORDER — SODIUM CHLORIDE 0.9 % IV BOLUS
500.0000 mL | Freq: Once | INTRAVENOUS | Status: AC
  Administered 2022-06-23: 500 mL via INTRAVENOUS

## 2022-06-23 MED ORDER — DICYCLOMINE HCL 20 MG PO TABS: 20.0000 mg | ORAL_TABLET | Freq: Three times a day (TID) | ORAL | 0 refills | Status: DC | PRN

## 2022-06-23 MED ORDER — KETOROLAC TROMETHAMINE 30 MG/ML IJ SOLN
30.0000 mg | Freq: Once | INTRAMUSCULAR | Status: AC
  Administered 2022-06-23: 30 mg via INTRAVENOUS
  Filled 2022-06-23: qty 1

## 2022-06-23 MED ORDER — ONDANSETRON 4 MG PO TBDP: 4.0000 mg | ORAL_TABLET | Freq: Three times a day (TID) | ORAL | 0 refills | Status: DC | PRN

## 2022-06-23 MED ORDER — IOHEXOL 300 MG/ML  SOLN
100.0000 mL | Freq: Once | INTRAMUSCULAR | Status: AC | PRN
  Administered 2022-06-23: 100 mL via INTRAVENOUS

## 2022-06-23 MED ORDER — DICYCLOMINE HCL 10 MG PO CAPS
10.0000 mg | ORAL_CAPSULE | Freq: Once | ORAL | Status: AC
  Administered 2022-06-23: 10 mg via ORAL
  Filled 2022-06-23: qty 1

## 2022-06-23 NOTE — Discharge Instructions (Signed)

## 2022-06-23 NOTE — ED Provider Notes (Signed)
Emergency Department Provider Note   I have reviewed the triage vital signs and the nursing notes.   HISTORY  Chief Complaint Abdominal Pain   HPI Nancy Palmer is a 37 y.o. female with past history of ureteral stone presents to the emergency department with intermittent, sharp, left upper quadrant pain.  No clear radiation to the back or lower abdomen.  No UTI symptoms.  Patient will get moments of severe pain and nausea but no vomiting.  These tend to wax and wane without clear provoking or modifying factors.  No pain into the chest or shortness of breath.  Notes that this feels different than kidney stone pain she has had in the past.   Past Medical History:  Diagnosis Date   Abnormal Pap smear    H/O varicella    HPV (human papilloma virus) anogenital infection    Panic attack    Smoker     Review of Systems  Constitutional: No fever/chills Cardiovascular: Denies chest pain. Respiratory: Denies shortness of breath. Gastrointestinal: Positive abdominal pain. Positive nausea, no vomiting.  No diarrhea.  No constipation. Genitourinary: Negative for dysuria. Musculoskeletal: Negative for back pain. Skin: Negative for rash. Neurological: Negative for headaches.   ____________________________________________   PHYSICAL EXAM:  VITAL SIGNS: ED Triage Vitals  Enc Vitals Group     BP 06/23/22 0704 (!) 157/94     Pulse Rate 06/23/22 0704 74     Resp 06/23/22 0704 20     Temp 06/23/22 0704 98.3 F (36.8 C)     Temp Source 06/23/22 0704 Oral     SpO2 06/23/22 0704 100 %     Weight 06/23/22 0703 200 lb (90.7 kg)     Height 06/23/22 0703 5' 1"$  (1.549 m)   Constitutional: Alert and oriented. Well appearing and in no acute distress. Eyes: Conjunctivae are normal.  Head: Atraumatic. Nose: No congestion/rhinnorhea. Mouth/Throat: Mucous membranes are moist. Neck: No stridor.  Cardiovascular: Normal rate, regular rhythm. Good peripheral circulation. Grossly normal  heart sounds.   Respiratory: Normal respiratory effort.  No retractions. Lungs CTAB. Gastrointestinal: Soft with mild epigastric tenderness. Minimal LUQ tenderness. No peritonitis. No distention.  Musculoskeletal: No lower extremity tenderness nor edema. No gross deformities of extremities. Neurologic:  Normal speech and language. No gross focal neurologic deficits are appreciated.  Skin:  Skin is warm, dry and intact. No rash noted.  ____________________________________________   LABS (all labs ordered are listed, but only abnormal results are displayed)  Labs Reviewed  LIPASE, BLOOD  COMPREHENSIVE METABOLIC PANEL  CBC  URINALYSIS, ROUTINE W REFLEX MICROSCOPIC  PREGNANCY, URINE   ____________________________________________  RADIOLOGY  No results found.  ____________________________________________   PROCEDURES  Procedure(s) performed:   Procedures  None ____________________________________________   INITIAL IMPRESSION / ASSESSMENT AND PLAN / ED COURSE  Pertinent labs & imaging results that were available during my care of the patient were reviewed by me and considered in my medical decision making (see chart for details).   This patient is Presenting for Evaluation of abdominal pain, which does require a range of treatment options, and is a complaint that involves a high risk of morbidity and mortality.  The Differential Diagnoses includes but is not exclusive to acute cholecystitis, intrathoracic causes for epigastric abdominal pain, gastritis, duodenitis, pancreatitis, small bowel or large bowel obstruction, abdominal aortic aneurysm, hernia, gastritis, etc.   Critical Interventions-    Medications  sodium chloride 0.9 % bolus 500 mL (has no administration in time range)  ketorolac (TORADOL)  30 MG/ML injection 30 mg (has no administration in time range)    Reassessment after intervention: Pain improved.   I decided to review pertinent External Data, and in  summary prior ED visits for kidney stones but no recent encounters.    Clinical Laboratory Tests Ordered, included ***  Radiologic Tests Ordered, included CT abdomen/pelvis. I independently interpreted the images and agree with radiology interpretation.   Cardiac Monitor Tracing which shows NSR.    Social Determinants of Health Risk no active smoking history.   Consult complete with  Medical Decision Making: Summary:  Patient presents to the emergency department for evaluation of left upper quadrant abdominal pain which is intermittent since 8 PM yesterday.  No radiation into the chest.  Fairly minimal tenderness on exam but plan for CT imaging for further evaluation.  Reevaluation with update and discussion with   ***Considered admission***  Patient's presentation is most consistent with acute presentation with potential threat to life or bodily function.   Disposition:   ____________________________________________  FINAL CLINICAL IMPRESSION(S) / ED DIAGNOSES  Final diagnoses:  None     NEW OUTPATIENT MEDICATIONS STARTED DURING THIS VISIT:  New Prescriptions   No medications on file    Note:  This document was prepared using Dragon voice recognition software and may include unintentional dictation errors.  Nanda Quinton, MD, [facility] Emergency Medicine

## 2022-06-23 NOTE — ED Triage Notes (Signed)
Patient arrives with complaints of intermittent left side abdominal pain that started overnight. Reports some nausea. No diarrhea/Vomiting.  She has a history of kidney stones, but the pain is different per the patient.

## 2022-06-23 NOTE — ED Notes (Signed)
Dc instructions reviewed with patient. Patient voiced understanding. Dc with belongings.  °

## 2022-10-21 DIAGNOSIS — R079 Chest pain, unspecified: Secondary | ICD-10-CM | POA: Diagnosis not present

## 2022-10-21 DIAGNOSIS — R0789 Other chest pain: Secondary | ICD-10-CM | POA: Diagnosis not present

## 2022-10-31 DIAGNOSIS — R079 Chest pain, unspecified: Secondary | ICD-10-CM | POA: Diagnosis not present

## 2022-11-26 ENCOUNTER — Other Ambulatory Visit: Payer: Self-pay | Admitting: Oncology

## 2022-11-26 DIAGNOSIS — Z006 Encounter for examination for normal comparison and control in clinical research program: Secondary | ICD-10-CM

## 2023-03-14 ENCOUNTER — Other Ambulatory Visit: Payer: Self-pay

## 2023-03-14 ENCOUNTER — Encounter (HOSPITAL_BASED_OUTPATIENT_CLINIC_OR_DEPARTMENT_OTHER): Payer: Self-pay | Admitting: *Deleted

## 2023-03-14 ENCOUNTER — Emergency Department (HOSPITAL_BASED_OUTPATIENT_CLINIC_OR_DEPARTMENT_OTHER)
Admission: EM | Admit: 2023-03-14 | Discharge: 2023-03-14 | Disposition: A | Discharge status: Home / Self Care | Payer: Commercial Managed Care - PPO | Attending: Emergency Medicine | Admitting: Emergency Medicine

## 2023-03-14 ENCOUNTER — Emergency Department (HOSPITAL_BASED_OUTPATIENT_CLINIC_OR_DEPARTMENT_OTHER): Payer: Commercial Managed Care - PPO | Admitting: Radiology

## 2023-03-14 DIAGNOSIS — Z1152 Encounter for screening for COVID-19: Secondary | ICD-10-CM | POA: Diagnosis not present

## 2023-03-14 DIAGNOSIS — J40 Bronchitis, not specified as acute or chronic: Secondary | ICD-10-CM | POA: Insufficient documentation

## 2023-03-14 DIAGNOSIS — R0602 Shortness of breath: Secondary | ICD-10-CM | POA: Diagnosis not present

## 2023-03-14 DIAGNOSIS — R059 Cough, unspecified: Secondary | ICD-10-CM | POA: Diagnosis present

## 2023-03-14 DIAGNOSIS — R058 Other specified cough: Secondary | ICD-10-CM | POA: Diagnosis not present

## 2023-03-14 DIAGNOSIS — R0789 Other chest pain: Secondary | ICD-10-CM | POA: Diagnosis not present

## 2023-03-14 LAB — RESP PANEL BY RT-PCR (RSV, FLU A&B, COVID)  RVPGX2
Influenza A by PCR: NEGATIVE
Influenza B by PCR: NEGATIVE
Resp Syncytial Virus by PCR: NEGATIVE
SARS Coronavirus 2 by RT PCR: NEGATIVE

## 2023-03-14 MED ORDER — BENZONATATE 100 MG PO CAPS
100.0000 mg | ORAL_CAPSULE | Freq: Once | ORAL | Status: AC
  Administered 2023-03-14: 100 mg via ORAL
  Filled 2023-03-14: qty 1

## 2023-03-14 MED ORDER — DOXYCYCLINE HYCLATE 100 MG PO CAPS: 100.0000 mg | ORAL_CAPSULE | Freq: Two times a day (BID) | ORAL | 0 refills | Status: DC

## 2023-03-14 MED ORDER — ALBUTEROL SULFATE HFA 108 (90 BASE) MCG/ACT IN AERS
2.0000 | INHALATION_SPRAY | Freq: Once | RESPIRATORY_TRACT | Status: AC
  Administered 2023-03-14: 2 via RESPIRATORY_TRACT
  Filled 2023-03-14: qty 6.7

## 2023-03-14 MED ORDER — ALBUTEROL SULFATE HFA 108 (90 BASE) MCG/ACT IN AERS: 1.0000 | INHALATION_SPRAY | Freq: Four times a day (QID) | RESPIRATORY_TRACT | 0 refills | Status: DC | PRN

## 2023-03-14 NOTE — Discharge Instructions (Signed)
Take the antibiotics as prescribed. Use the albuterol as needed for coughing and SOB every 4 hours. Followup with your doctor. Return to the ED with exertional chest pain, pain associated with shortness of breathing, vomiting, sweating or any other concerns.

## 2023-03-14 NOTE — ED Notes (Signed)
Reviewed AVS with patient, patient expressed understanding of directions, denies further questions at this time. 

## 2023-03-14 NOTE — ED Provider Notes (Signed)
[place] EMERGENCY DEPARTMENT AT [facility] Provider Note   CSN: [id number] Arrival date & time: 03/14/23  7829     History  Chief Complaint  Patient presents with   Cough    Nancy Palmer is a 37 y.o. female.  Patient reports "sick" for the past 1 week but worse in the past 2 to 3 days.  States congestion in her chest, productive cough of green and yellow mucus, body aches, chills, headache sore throat.  Feels heaviness on her chest worse with coughing.  Using ibuprofen at home without relief.  Has had multiple sick contacts at home with her children.  Tested for COVID recently which was negative.  Comes in tonight with difficulty sleeping and trouble laying flat due to congestion in her chest and nose.  Denies history of asthma or COPD.  Denies any heart issues.  Does not think she has had a fever at home but has not checked her temperature.  Not much rhinorrhea or headache.  Feels short of breath and has a hoarse voice and has been coughing.  Eating and drinking well, no vomiting or diarrhea.  The history is provided by the patient.  Cough Associated symptoms: headaches, myalgias, shortness of breath and sore throat   Associated symptoms: no chest pain and no fever        Home Medications Prior to Admission medications   Not on File      Allergies    Penicillins    Review of Systems   Review of Systems  Constitutional:  Positive for fatigue. Negative for activity change, appetite change and fever.  HENT:  Positive for congestion and sore throat.   Eyes:  Negative for visual disturbance.  Respiratory:  Positive for cough, chest tightness and shortness of breath.   Cardiovascular:  Negative for chest pain.  Gastrointestinal:  Negative for abdominal pain, nausea and vomiting.  Genitourinary:  Negative for dysuria and hematuria.  Musculoskeletal:  Positive for arthralgias and myalgias.  Neurological:  Positive for weakness and headaches.   all other systems  are negative except as noted in the HPI and PMH.    Physical Exam Updated Vital Signs BP 119/69   Pulse 92   Temp 98.8 F (37.1 C)   Resp 20   Ht 5\' 1"  (1.549 m)   Wt 95.3 kg   LMP 02/24/2023 (Approximate)   SpO2 98%   BMI 39.68 kg/m  Physical Exam Vitals and nursing note reviewed.  Constitutional:      General: She is not in acute distress.    Appearance: She is well-developed.     Comments: No distress, hoarse voice  HENT:     Head: Normocephalic and atraumatic.     Right Ear: Tympanic membrane normal.     Left Ear: Tympanic membrane normal.     Mouth/Throat:     Mouth: Mucous membranes are moist.     Pharynx: No oropharyngeal exudate.  Eyes:     Conjunctiva/sclera: Conjunctivae normal.     Pupils: Pupils are equal, round, and reactive to light.  Neck:     Comments: No meningismus. Cardiovascular:     Rate and Rhythm: Normal rate and regular rhythm.     Heart sounds: Normal heart sounds. No murmur heard. Pulmonary:     Effort: Pulmonary effort is normal. No respiratory distress.     Breath sounds: Normal breath sounds. No wheezing.  Abdominal:     Palpations: Abdomen is soft.     Tenderness: There  is no abdominal tenderness. There is no guarding or rebound.  Musculoskeletal:        General: No tenderness. Normal range of motion.     Cervical back: Normal range of motion and neck supple.  Skin:    General: Skin is warm.  Neurological:     Mental Status: She is alert and oriented to person, place, and time.     Cranial Nerves: No cranial nerve deficit.     Motor: No abnormal muscle tone.     Coordination: Coordination normal.     Comments:  5/5 strength throughout. CN 2-12 intact.Equal grip strength.   Psychiatric:        Behavior: Behavior normal.     ED Results / Procedures / Treatments   Labs (all labs ordered are listed, but only abnormal results are displayed) Labs Reviewed  RESP PANEL BY RT-PCR (RSV, FLU A&B, COVID)  [id number]    EKG EKG  Interpretation Date/Time:  Friday March 14 2023 05:36:02 EDT Ventricular Rate:  93 PR Interval:  165 QRS Duration:  80 QT Interval:  339 QTC Calculation: 422 R Axis:   34  Text Interpretation: Sinus rhythm Low voltage, precordial leads No significant change was found Confirmed by Glynn Octave [phone]) on 03/14/2023 5:41:28 AM  Radiology DG Chest 2 View  Result Date: 03/14/2023 CLINICAL DATA:  Cough and shortness of breath. Productive cough for 1 week. EXAM: CHEST - 2 VIEW COMPARISON:  None Available. FINDINGS: Artifact from EKG leads. Normal heart size and mediastinal contours. No acute infiltrate or edema. No effusion or pneumothorax. No acute osseous findings. IMPRESSION: No active cardiopulmonary disease. Electronically Signed   By: Tiburcio Pea M.D.   On: 03/14/2023 06:47    Procedures Procedures    Medications Ordered in ED Medications  albuterol (VENTOLIN HFA) 108 (90 Base) MCG/ACT inhaler 2 puff (has no administration in time range)  benzonatate (TESSALON) capsule 100 mg (100 mg Oral Given 03/14/23 0530)    ED Course/ Medical Decision Making/ A&P                                 Medical Decision Making Amount and/or Complexity of Data Reviewed Labs: ordered. Decision-making details documented in ED Course. Radiology: ordered and independent interpretation performed. Decision-making details documented in ED Course. ECG/medicine tests: ordered and independent interpretation performed. Decision-making details documented in ED Course.  Risk Prescription drug management.   1 week of URI symptoms with cough, congestion, sore throat, body aches, chest tightness.  No hypoxia or increased work of breathing.  Clear lungs.  Given albuterol and tessalon. No wheezing. Defer steroids.  CXR without infiltrate or edema. Results reviewed and interpreted by me.  COVID and flu swabs negative.   No hypoxia or increased work of breathing. Suspect viral bronchitis but given  length of illness will cover with antibiotics.  Followup with PCP.  Return to the ED with exertional chest pain, pain associated with shortness of breath, vomiting, diaphoresis or any other concerns.        Final Clinical Impression(s) / ED Diagnoses Final diagnoses:  Bronchitis    Rx / DC Orders ED Discharge Orders     None         Renea Schoonmaker, Jeannett Senior, MD 03/14/23 [phone]

## 2023-03-14 NOTE — ED Triage Notes (Signed)
Pt has had flu like symptoms for the past week. Fevers unknown. Took a recent home covid which was negative. Pt has a prod cough, yellow/green in color. States she feels like congestion is in her chest. Sats 97% on RA. No distress.

## 2023-04-28 ENCOUNTER — Other Ambulatory Visit: Payer: Self-pay

## 2023-04-28 ENCOUNTER — Emergency Department (HOSPITAL_BASED_OUTPATIENT_CLINIC_OR_DEPARTMENT_OTHER): Payer: Commercial Managed Care - PPO | Admitting: Radiology

## 2023-04-28 DIAGNOSIS — J069 Acute upper respiratory infection, unspecified: Secondary | ICD-10-CM | POA: Diagnosis not present

## 2023-04-28 DIAGNOSIS — R0602 Shortness of breath: Secondary | ICD-10-CM | POA: Diagnosis not present

## 2023-04-28 DIAGNOSIS — B9789 Other viral agents as the cause of diseases classified elsewhere: Secondary | ICD-10-CM | POA: Diagnosis not present

## 2023-04-28 DIAGNOSIS — R059 Cough, unspecified: Secondary | ICD-10-CM | POA: Diagnosis not present

## 2023-04-28 DIAGNOSIS — Z1152 Encounter for screening for COVID-19: Secondary | ICD-10-CM | POA: Diagnosis not present

## 2023-04-28 DIAGNOSIS — R0989 Other specified symptoms and signs involving the circulatory and respiratory systems: Secondary | ICD-10-CM | POA: Diagnosis not present

## 2023-04-28 NOTE — ED Triage Notes (Addendum)
Pt C/O of congestion, coughing, SOB started about 4 days ago. Reports being around daughter who has pneumonia. Denies chest pain, sore throat. NAD.

## 2023-04-29 ENCOUNTER — Emergency Department (HOSPITAL_BASED_OUTPATIENT_CLINIC_OR_DEPARTMENT_OTHER)
Admission: EM | Admit: 2023-04-29 | Discharge: 2023-04-29 | Disposition: A | Discharge status: Home / Self Care | Payer: Commercial Managed Care - PPO | Attending: Emergency Medicine | Admitting: Emergency Medicine

## 2023-04-29 DIAGNOSIS — J069 Acute upper respiratory infection, unspecified: Secondary | ICD-10-CM

## 2023-04-29 LAB — RESP PANEL BY RT-PCR (RSV, FLU A&B, COVID)  RVPGX2
Influenza A by PCR: NEGATIVE
Influenza B by PCR: NEGATIVE
Resp Syncytial Virus by PCR: NEGATIVE
SARS Coronavirus 2 by RT PCR: NEGATIVE

## 2023-04-29 MED ORDER — PREDNISONE 50 MG PO TABS
60.0000 mg | ORAL_TABLET | Freq: Once | ORAL | Status: AC
  Administered 2023-04-29: 60 mg via ORAL
  Filled 2023-04-29: qty 1

## 2023-04-29 MED ORDER — GUAIFENESIN 100 MG/5ML PO LIQD
5.0000 mL | Freq: Once | ORAL | Status: AC
  Administered 2023-04-29: 5 mL via ORAL
  Filled 2023-04-29: qty 10

## 2023-04-29 MED ORDER — PREDNISONE 10 MG PO TABS: 20.0000 mg | ORAL_TABLET | Freq: Every day | ORAL | 0 refills | Status: DC

## 2023-04-29 NOTE — ED Provider Notes (Signed)
[place] EMERGENCY DEPARTMENT AT [facility] Provider Note   CSN: [id number] Arrival date & time: 04/28/23  2333     History  No chief complaint on file.   Nancy Palmer is a 37 y.o. female.  The history is provided by the patient.  URI Presenting symptoms: congestion and cough   Presenting symptoms: no fever   Severity:  Moderate Onset quality:  Gradual Duration:  4 days Progression:  Unchanged Chronicity:  New Relieved by:  Nothing Worsened by:  Nothing Ineffective treatments: mucinex. Associated symptoms: no arthralgias and no headaches   Risk factors: not elderly   Patient was exposed to her daughter who was diagnosed with pneumonia and the patient now has congestion and cough.       Home Medications Prior to Admission medications   Medication Sig Start Date End Date Taking? Authorizing Provider  predniSONE (DELTASONE) 10 MG tablet Take 2 tablets (20 mg total) by mouth daily. 04/29/23  Yes Terriah Reggio, MD  albuterol (VENTOLIN HFA) 108 (90 Base) MCG/ACT inhaler Inhale 1-2 puffs into the lungs every 6 (six) hours as needed for wheezing or shortness of breath. 03/14/23   Rancour, Jeannett Senior, MD  doxycycline (VIBRAMYCIN) 100 MG capsule Take 1 capsule (100 mg total) by mouth 2 (two) times daily. 03/14/23   Glynn Octave, MD      Allergies    Penicillins    Review of Systems   Review of Systems  Constitutional:  Negative for fever.  HENT:  Positive for congestion.   Eyes:  Negative for redness.  Respiratory:  Positive for cough.   Musculoskeletal:  Negative for arthralgias.  Neurological:  Negative for headaches.  All other systems reviewed and are negative.   Physical Exam Updated Vital Signs BP 134/86 (BP Location: Right Arm)   Pulse 88   Temp 98.5 F (36.9 C) (Oral)   Resp 18   SpO2 100%  Physical Exam Vitals and nursing note reviewed.  Constitutional:      General: She is not in acute distress.    Appearance: Normal appearance. She is  well-developed.  HENT:     Head: Normocephalic and atraumatic.     Nose: Congestion present.  Eyes:     Pupils: Pupils are equal, round, and reactive to light.  Cardiovascular:     Rate and Rhythm: Normal rate and regular rhythm.     Pulses: Normal pulses.     Heart sounds: Normal heart sounds.  Pulmonary:     Effort: Pulmonary effort is normal. No respiratory distress.     Breath sounds: Normal breath sounds. No stridor. No wheezing, rhonchi or rales.  Abdominal:     General: Bowel sounds are normal. There is no distension.     Palpations: Abdomen is soft.     Tenderness: There is no abdominal tenderness. There is no guarding or rebound.  Musculoskeletal:        General: Normal range of motion.     Cervical back: Neck supple.  Skin:    General: Skin is dry.     Capillary Refill: Capillary refill takes less than 2 seconds.     Findings: No erythema or rash.  Neurological:     General: No focal deficit present.     Mental Status: She is alert.     Deep Tendon Reflexes: Reflexes normal.  Psychiatric:        Mood and Affect: Mood normal.     ED Results / Procedures / Treatments   Labs (all  labs ordered are listed, but only abnormal results are displayed) Results for orders placed or performed during the hospital encounter of 04/29/23  Resp panel by RT-PCR (RSV, Flu A&B, Covid) Anterior Nasal Swab   Collection Time: 04/28/23 11:43 PM   Specimen: Anterior Nasal Swab  Result Value Ref Range   SARS Coronavirus 2 by RT PCR NEGATIVE NEGATIVE   Influenza A by PCR NEGATIVE NEGATIVE   Influenza B by PCR NEGATIVE NEGATIVE   Resp Syncytial Virus by PCR NEGATIVE NEGATIVE   DG Chest 2 View Result Date: 04/28/2023 CLINICAL DATA:  Shortness of breath.  Cough and congestion. EXAM: CHEST - 2 VIEW COMPARISON:  03/14/2023 FINDINGS: The cardiomediastinal contours are normal. The lungs are clear. Pulmonary vasculature is normal. No consolidation, pleural effusion, or pneumothorax. No acute  osseous abnormalities are seen. IMPRESSION: No active cardiopulmonary disease. Electronically Signed   By: Narda Rutherford M.D.   On: 04/28/2023 23:59     Radiology DG Chest 2 View Result Date: 04/28/2023 CLINICAL DATA:  Shortness of breath.  Cough and congestion. EXAM: CHEST - 2 VIEW COMPARISON:  03/14/2023 FINDINGS: The cardiomediastinal contours are normal. The lungs are clear. Pulmonary vasculature is normal. No consolidation, pleural effusion, or pneumothorax. No acute osseous abnormalities are seen. IMPRESSION: No active cardiopulmonary disease. Electronically Signed   By: Narda Rutherford M.D.   On: 04/28/2023 23:59    Procedures Procedures    Medications Ordered in ED Medications  predniSONE (DELTASONE) tablet 60 mg (has no administration in time range)  guaiFENesin (ROBITUSSIN) 100 MG/5ML liquid 5 mL (has no administration in time range)    ED Course/ Medical Decision Making/ A&P                                 Medical Decision Making Patient with URI and cough x 4 days   Amount and/or Complexity of Data Reviewed External Data Reviewed: notes.    Details: Previous notes reviewed  Labs: ordered.    Details: Negative covid and flu test  Radiology: ordered and independent interpretation performed.    Details: Normal CXR, no PNA  Risk OTC drugs. Prescription drug management. Risk Details: Steroids in addition to mucinex to help with congestion and cough.  Well appearing normal exam and vitals.  Stable for discharge.  Strict returns     Final Clinical Impression(s) / ED Diagnoses Final diagnoses:  Viral URI with cough   Return for intractable cough, coughing up blood, fevers > 100.4 unrelieved by medication, shortness of breath, intractable vomiting, chest pain, shortness of breath, weakness, numbness, changes in speech, facial asymmetry, abdominal pain, passing out, Inability to tolerate liquids or food, cough, altered mental status or any concerns. No signs of  systemic illness or infection. The patient is nontoxic-appearing on exam and vital signs are within normal limits.  I have reviewed the triage vital signs and the nursing notes. Pertinent labs & imaging results that were available during my care of the patient were reviewed by me and considered in my medical decision making (see chart for details). After history, exam, and medical workup I feel the patient has been appropriately medically screened and is safe for discharge home. Pertinent diagnoses were discussed with the patient. Patient was given return precautions. Rx / DC Orders ED Discharge Orders          Ordered    predniSONE (DELTASONE) 10 MG tablet  Daily  04/29/23 0220              Ramirez Fullbright, MD 04/29/23 2536

## 2023-04-29 NOTE — ED Notes (Signed)
Coughing, congested and SHOB x 4 days Dtg recently had pneumonia

## 2023-04-30 ENCOUNTER — Encounter (HOSPITAL_BASED_OUTPATIENT_CLINIC_OR_DEPARTMENT_OTHER): Payer: Self-pay

## 2023-04-30 ENCOUNTER — Other Ambulatory Visit: Payer: Self-pay

## 2023-04-30 DIAGNOSIS — R0602 Shortness of breath: Secondary | ICD-10-CM | POA: Diagnosis not present

## 2023-04-30 DIAGNOSIS — R051 Acute cough: Secondary | ICD-10-CM | POA: Diagnosis not present

## 2023-04-30 DIAGNOSIS — R059 Cough, unspecified: Secondary | ICD-10-CM | POA: Diagnosis present

## 2023-04-30 NOTE — ED Triage Notes (Addendum)
Pt arrives with cough and congestion that started about a week ago. Pt denies CP or SOB. Pt seen for the same a couple of days ago. Pt was put on a steroid.

## 2023-05-01 ENCOUNTER — Emergency Department (HOSPITAL_BASED_OUTPATIENT_CLINIC_OR_DEPARTMENT_OTHER)
Admission: EM | Admit: 2023-05-01 | Discharge: 2023-05-01 | Disposition: A | Discharge status: Home / Self Care | Payer: Commercial Managed Care - PPO | Attending: Emergency Medicine | Admitting: Emergency Medicine

## 2023-05-01 DIAGNOSIS — R051 Acute cough: Secondary | ICD-10-CM

## 2023-05-01 MED ORDER — BENZONATATE 100 MG PO CAPS
200.0000 mg | ORAL_CAPSULE | Freq: Once | ORAL | Status: AC
  Administered 2023-05-01: 200 mg via ORAL
  Filled 2023-05-01: qty 2

## 2023-05-01 MED ORDER — BENZONATATE 100 MG PO CAPS: 100.0000 mg | ORAL_CAPSULE | Freq: Three times a day (TID) | ORAL | 0 refills | Status: DC | PRN

## 2023-05-01 MED ORDER — IBUPROFEN 400 MG PO TABS
600.0000 mg | ORAL_TABLET | Freq: Once | ORAL | Status: AC
  Administered 2023-05-01: 600 mg via ORAL
  Filled 2023-05-01: qty 1

## 2023-05-01 NOTE — ED Provider Notes (Signed)
[place] EMERGENCY DEPARTMENT AT [facility] Provider Note   CSN: [id number] Arrival date & time: 04/30/23  2258     History  Chief Complaint  Patient presents with   Cough    Nancy Palmer is a 37 y.o. female.  The history is provided by the patient.  Cough Nancy Palmer is a 37 y.o. female who presents to the Emergency Department complaining of cough.  She presents to the emergency department for evaluation of cough.  She has been experiencing URI symptoms for 1 week.  The cough feels like there is something in her throat that she cannot fully clear up.  She is taking Mucinex, which does not provide relief.  She has occasional shortness of breath.  No fever, chest pain, nausea, vomiting.  She was seen in the emergency department and started on steroids 2 days ago.  She has no known medical problems.      Home Medications Prior to Admission medications   Medication Sig Start Date End Date Taking? Authorizing Provider  benzonatate (TESSALON) 100 MG capsule Take 1 capsule (100 mg total) by mouth 3 (three) times daily as needed for cough. 05/01/23  Yes Tilden Fossa, MD  albuterol (VENTOLIN HFA) 108 (90 Base) MCG/ACT inhaler Inhale 1-2 puffs into the lungs every 6 (six) hours as needed for wheezing or shortness of breath. 03/14/23   Rancour, Jeannett Senior, MD  doxycycline (VIBRAMYCIN) 100 MG capsule Take 1 capsule (100 mg total) by mouth 2 (two) times daily. 03/14/23   Rancour, Jeannett Senior, MD  predniSONE (DELTASONE) 10 MG tablet Take 2 tablets (20 mg total) by mouth daily. 04/29/23   Palumbo, April, MD      Allergies    Penicillins    Review of Systems   Review of Systems  Respiratory:  Positive for cough.   All other systems reviewed and are negative.   Physical Exam Updated Vital Signs BP 138/86   Pulse 89   Temp 98 F (36.7 C) (Oral)   Resp 18   Wt 95.3 kg   SpO2 99%   BMI 39.68 kg/m  Physical Exam Vitals and nursing note reviewed.  Constitutional:       Appearance: She is well-developed.  HENT:     Head: Normocephalic and atraumatic.     Right Ear: Tympanic membrane normal.     Left Ear: Tympanic membrane normal.     Mouth/Throat:     Mouth: Mucous membranes are moist.     Pharynx: No oropharyngeal exudate or posterior oropharyngeal erythema.  Neck:     Comments: No thyromegaly Cardiovascular:     Rate and Rhythm: Normal rate and regular rhythm.     Heart sounds: No murmur heard. Pulmonary:     Effort: Pulmonary effort is normal. No respiratory distress.     Breath sounds: Normal breath sounds.  Abdominal:     Palpations: Abdomen is soft.     Tenderness: There is no abdominal tenderness. There is no guarding or rebound.  Musculoskeletal:        General: No tenderness.     Cervical back: Neck supple. No tenderness.  Lymphadenopathy:     Cervical: No cervical adenopathy.  Skin:    General: Skin is warm and dry.  Neurological:     Mental Status: She is alert and oriented to person, place, and time.  Psychiatric:        Behavior: Behavior normal.     ED Results / Procedures / Treatments   Labs (  all labs ordered are listed, but only abnormal results are displayed) Labs Reviewed - No data to display  EKG None  Radiology No results found.  Procedures Procedures    Medications Ordered in ED Medications  benzonatate (TESSALON) capsule 200 mg (200 mg Oral Given 05/01/23 0336)  ibuprofen (ADVIL) tablet 600 mg (600 mg Oral Given 05/01/23 6578)    ED Course/ Medical Decision Making/ A&P                                 Medical Decision Making Risk Prescription drug management.   Patient here for evaluation of cough, has had symptoms for 1 week.  No respiratory distress on examination with good air movement bilaterally.  No evidence of RPA, PTA or epiglottitis on examination.  No wheezing.  No evidence of asthma or reactive airway.  No evidence of anaphylaxis.  Picture is not consistent with pneumonia, PE.  Will  provide Tessalon Perles for cough.  Discussed additional OTC medications that she may trial.  Discussed outpatient follow-up and return precautions.        Final Clinical Impression(s) / ED Diagnoses Final diagnoses:  Acute cough    Rx / DC Orders ED Discharge Orders          Ordered    benzonatate (TESSALON) 100 MG capsule  3 times daily PRN        05/01/23 0332              Tilden Fossa, MD 05/01/23 [id number]

## 2023-05-06 DIAGNOSIS — J069 Acute upper respiratory infection, unspecified: Secondary | ICD-10-CM | POA: Diagnosis not present

## 2023-05-06 DIAGNOSIS — R058 Other specified cough: Secondary | ICD-10-CM | POA: Diagnosis not present

## 2023-06-08 DIAGNOSIS — M7702 Medial epicondylitis, left elbow: Secondary | ICD-10-CM | POA: Diagnosis not present

## 2023-07-02 ENCOUNTER — Other Ambulatory Visit (HOSPITAL_COMMUNITY): Payer: Self-pay

## 2023-07-25 ENCOUNTER — Ambulatory Visit
Admission: EM | Admit: 2023-07-25 | Discharge: 2023-07-25 | Disposition: A | Discharge status: Home / Self Care | Attending: Family Medicine | Admitting: Family Medicine

## 2023-07-25 DIAGNOSIS — F410 Panic disorder [episodic paroxysmal anxiety] without agoraphobia: Secondary | ICD-10-CM

## 2023-07-25 MED ORDER — HYDROXYZINE HCL 25 MG PO TABS: 12.5000 mg | ORAL_TABLET | Freq: Three times a day (TID) | ORAL | 0 refills | Status: AC | PRN

## 2023-07-25 MED ORDER — BUSPIRONE HCL 5 MG PO TABS: 5.0000 mg | ORAL_TABLET | Freq: Every day | ORAL | 0 refills | Status: AC

## 2023-07-25 NOTE — ED Provider Notes (Signed)
 [facility]  Note:  This document was prepared using Conservation officer, historic buildings and may include unintentional dictation errors.  MRN: [id number] DOB: 1985/07/21  Subjective:   Nancy Palmer is a 38 y.o. female presenting for 4-day history of recurrent random panic attacks.  Patient reports that she cannot identify a trigger.  Symptoms occur randomly and cause shaking, panic attack feelings, feels pain all over.  Has previously had bouts of anxiety but was years ago.  This is in her family as well, she would occasionally use a Valium or Xanax that they provided to her.  She never had any particular issue with abusing these medications.  Has not taken any antianxiety medications consistently.  She was prescribed on long-term anxiety medication which she never ended up taking.  Denies any SI, HI.  No chronic medications.    Allergies  Allergen Reactions   Penicillins Other (See Comments)    Childhood reaction, unknown Has patient had a PCN reaction causing immediate rash, facial/tongue/throat swelling, SOB or lightheadedness with hypotension: No Has patient had a PCN reaction causing severe rash involving mucus membranes or skin necrosis: Possibly hives  Has patient had a PCN reaction that required hospitalization No Has patient had a PCN reaction occurring within the last 10 years: Yes If all of the above answers are "NO", then may proceed with Cephalosporin use.     Past Medical History:  Diagnosis Date   Abnormal Pap smear    H/O varicella    HPV (human papilloma virus) anogenital infection    Panic attack    Smoker      Past Surgical History:  Procedure Laterality Date   CRYOTHERAPY  2008 cervix   MANDIBLE SURGERY      Family History  Problem Relation Age of Onset   COPD Maternal Grandmother    Diabetes Paternal Grandmother    Heart disease Father    COPD Paternal Grandfather    Other Neg Hx     Social History   Tobacco Use    Smoking status: Former    Current packs/day: 0.00    Types: Cigarettes    Quit date: 12/11/2010    Years since quitting: 12.6   Smokeless tobacco: Never  Vaping Use   Vaping status: Never Used  Substance Use Topics   Alcohol use: Yes    Comment: Rarely   Drug use: No    ROS   Objective:   Vitals: BP 129/84 (BP Location: Right Arm)   Pulse 70   Temp 98.8 F (37.1 C) (Oral)   Resp 16   LMP 07/18/2023 (Exact Date)   SpO2 98%   Physical Exam Constitutional:      General: She is not in acute distress.    Appearance: Normal appearance. She is well-developed. She is not ill-appearing, toxic-appearing or diaphoretic.  HENT:     Head: Normocephalic and atraumatic.     Nose: Nose normal.     Mouth/Throat:     Mouth: Mucous membranes are moist.  Eyes:     General: No scleral icterus.       Right eye: No discharge.        Left eye: No discharge.     Extraocular Movements: Extraocular movements intact.  Neck:     Thyroid: No thyroid mass, thyromegaly or thyroid tenderness.  Cardiovascular:     Rate and Rhythm: Normal rate and regular rhythm.     Heart sounds: Normal heart sounds. No murmur heard.  No friction rub. No gallop.  Pulmonary:     Effort: Pulmonary effort is normal. No respiratory distress.     Breath sounds: No stridor. No wheezing, rhonchi or rales.  Chest:     Chest wall: No tenderness.  Skin:    General: Skin is warm and dry.  Neurological:     General: No focal deficit present.     Mental Status: She is alert and oriented to person, place, and time.     Cranial Nerves: No cranial nerve deficit.     Motor: No weakness.     Coordination: Coordination normal.     Gait: Gait normal.     Deep Tendon Reflexes: Reflexes normal.  Psychiatric:        Mood and Affect: Mood normal.        Behavior: Behavior normal.     Comments: Anxious demeanor.  No SI, HI.     Assessment and Plan :   PDMP not reviewed this encounter.  1. Panic attacks    Recommend  patient use BuSpar regularly and hydroxyzine as needed.  It is my best clinical estimation that she is not at risk of harming herself or others.  Referral placed to behavioral health.  Counseled patient on potential for adverse effects with medications prescribed/recommended today, ER and return-to-clinic precautions discussed, patient verbalized understanding.    Wallis Bamberg, [place] 07/25/23 9147

## 2023-07-25 NOTE — Discharge Instructions (Addendum)
 I have placed a referral to behavioral therapy clinic with [facility]. In the meantime, I am prescribing hydroxyzine as a short acting anti-anxiety medicine that can be used with panic attacks. As an effort to start helping more regularly, start taking Buspar once daily before going to work.

## 2023-07-25 NOTE — ED Triage Notes (Signed)
 Pt reports having anxiety attacks x 4 days. She feels he head shakes, "eyes on fire", back pain when having anxiety arracks. Taking naps improves the anxiety.

## 2023-07-26 LAB — COMPREHENSIVE METABOLIC PANEL
ALT: 12 IU/L (ref 0–32)
AST: 16 IU/L (ref 0–40)
Albumin: 4.3 g/dL (ref 3.9–4.9)
Alkaline Phosphatase: 120 IU/L (ref 44–121)
BUN/Creatinine Ratio: 10 (ref 9–23)
BUN: 8 mg/dL (ref 6–20)
Bilirubin Total: 0.4 mg/dL (ref 0.0–1.2)
CO2: 21 mmol/L (ref 20–29)
Calcium: 8.8 mg/dL (ref 8.7–10.2)
Chloride: 109 mmol/L — ABNORMAL HIGH (ref 96–106)
Creatinine, Ser: 0.84 mg/dL (ref 0.57–1.00)
Globulin, Total: 2.7 g/dL (ref 1.5–4.5)
Glucose: 88 mg/dL (ref 70–99)
Potassium: 4 mmol/L (ref 3.5–5.2)
Sodium: 141 mmol/L (ref 134–144)
Total Protein: 7 g/dL (ref 6.0–8.5)
eGFR: 91 mL/min/{1.73_m2} (ref >59)

## 2023-07-26 LAB — CBC
Hematocrit: 33.1 % — ABNORMAL LOW (ref 34.0–46.6)
Hemoglobin: 10.3 g/dL — ABNORMAL LOW (ref 11.1–15.9)
MCH: 24.5 pg — ABNORMAL LOW (ref 26.6–33.0)
MCHC: 31.1 g/dL — ABNORMAL LOW (ref 31.5–35.7)
MCV: 79 fL (ref 79–97)
Platelets: 244 10*3/uL (ref 150–450)
RBC: 4.2 x10E6/uL (ref 3.77–5.28)
RDW: 14.9 % (ref 11.7–15.4)
WBC: 7.9 10*3/uL (ref 3.4–10.8)

## 2023-07-26 LAB — TSH: TSH: 1.02 u[IU]/mL (ref 0.450–4.500)

## 2023-08-12 ENCOUNTER — Encounter (HOSPITAL_BASED_OUTPATIENT_CLINIC_OR_DEPARTMENT_OTHER): Payer: Self-pay | Admitting: Emergency Medicine

## 2023-08-12 ENCOUNTER — Emergency Department (HOSPITAL_BASED_OUTPATIENT_CLINIC_OR_DEPARTMENT_OTHER)

## 2023-08-12 ENCOUNTER — Emergency Department (HOSPITAL_BASED_OUTPATIENT_CLINIC_OR_DEPARTMENT_OTHER)
Admission: EM | Admit: 2023-08-12 | Discharge: 2023-08-12 | Disposition: A | Discharge status: Home / Self Care | Attending: Emergency Medicine | Admitting: Emergency Medicine

## 2023-08-12 ENCOUNTER — Other Ambulatory Visit: Payer: Self-pay

## 2023-08-12 DIAGNOSIS — R109 Unspecified abdominal pain: Secondary | ICD-10-CM | POA: Diagnosis present

## 2023-08-12 LAB — CBC WITH DIFFERENTIAL/PLATELET
Abs Immature Granulocytes: 0.03 10*3/uL (ref 0.00–0.07)
Basophils Absolute: 0 10*3/uL (ref 0.0–0.1)
Basophils Relative: 0 %
Eosinophils Absolute: 0.1 10*3/uL (ref 0.0–0.5)
Eosinophils Relative: 1 %
HCT: 33.5 % — ABNORMAL LOW (ref 36.0–46.0)
Hemoglobin: 10.2 g/dL — ABNORMAL LOW (ref 12.0–15.0)
Immature Granulocytes: 0 %
Lymphocytes Relative: 25 %
Lymphs Abs: 2.3 10*3/uL (ref 0.7–4.0)
MCH: 24.2 pg — ABNORMAL LOW (ref 26.0–34.0)
MCHC: 30.4 g/dL (ref 30.0–36.0)
MCV: 79.6 fL — ABNORMAL LOW (ref 80.0–100.0)
Monocytes Absolute: 0.9 10*3/uL (ref 0.1–1.0)
Monocytes Relative: 9 %
Neutro Abs: 6 10*3/uL (ref 1.7–7.7)
Neutrophils Relative %: 65 %
Platelets: 219 10*3/uL (ref 150–400)
RBC: 4.21 MIL/uL (ref 3.87–5.11)
RDW: 15.7 % — ABNORMAL HIGH (ref 11.5–15.5)
WBC: 9.4 10*3/uL (ref 4.0–10.5)
nRBC: 0 % (ref 0.0–0.2)

## 2023-08-12 LAB — BASIC METABOLIC PANEL WITH GFR
Anion gap: 9 (ref 5–15)
BUN: 7 mg/dL (ref 6–20)
CO2: 22 mmol/L (ref 22–32)
Calcium: 8.3 mg/dL — ABNORMAL LOW (ref 8.9–10.3)
Chloride: 108 mmol/L (ref 98–111)
Creatinine, Ser: 0.87 mg/dL (ref 0.44–1.00)
GFR, Estimated: >60 mL/min (ref >60)
Glucose, Bld: 89 mg/dL (ref 70–99)
Potassium: 3.3 mmol/L — ABNORMAL LOW (ref 3.5–5.1)
Sodium: 139 mmol/L (ref 135–145)

## 2023-08-12 LAB — URINALYSIS, ROUTINE W REFLEX MICROSCOPIC
Bilirubin Urine: NEGATIVE
Glucose, UA: NEGATIVE mg/dL
Hgb urine dipstick: NEGATIVE
Ketones, ur: NEGATIVE mg/dL
Leukocytes,Ua: NEGATIVE
Nitrite: NEGATIVE
Protein, ur: NEGATIVE mg/dL
Specific Gravity, Urine: 1.014 (ref 1.005–1.030)
pH: 6 (ref 5.0–8.0)

## 2023-08-12 LAB — PREGNANCY, URINE: Preg Test, Ur: NEGATIVE

## 2023-08-12 MED ORDER — NAPROXEN 500 MG PO TABS: 500.0000 mg | ORAL_TABLET | Freq: Two times a day (BID) | ORAL | 0 refills | Status: DC | PRN

## 2023-08-12 MED ORDER — KETOROLAC TROMETHAMINE 30 MG/ML IJ SOLN
30.0000 mg | Freq: Once | INTRAMUSCULAR | Status: AC
  Administered 2023-08-12: 30 mg via INTRAMUSCULAR
  Filled 2023-08-12: qty 1

## 2023-08-12 NOTE — ED Triage Notes (Signed)
 Patient coming to ED for evaluation of bilateral flank/back pain x 3 days.  Reports "I think it might be a kidney infection."  No reports of fevers.  No burning with urination.  No nausea or vomiting

## 2023-08-12 NOTE — Discharge Instructions (Signed)
 No evidence of UTI.  You may have passed a kidney stone.  Take the anti-inflammatories as prescribed and follow-up with your doctor.  Return to the ED if worsening pain, weakness, numbness, tingling, bowel or bladder incontinence or other concerns.

## 2023-08-12 NOTE — ED Provider Notes (Signed)
 [place] EMERGENCY DEPARTMENT AT [facility] Provider Note   CSN: [id number] Arrival date & time: 08/12/23  0056     History  Chief Complaint  Patient presents with   Flank Pain    Nancy Palmer is a 38 y.o. female.  Patient reports concern for kidney infection.  Reports bilateral flank pain for the past 3 days that is fairly constant.  Taking ibuprofen with partial relief.  Denies any injury.  No radiation of the pain down her legs.  No weakness, numbness, tingling, bowel or bladder incontinence.  No fever or vomiting.  Denies pain with urination or blood in the urine.  No no vaginal bleeding or discharge.  Does have a history of kidney stones as well as UTIs. However denies any dysuria hematuria today.  Denies abdominal pain, chest pain or shortness of breath.  The history is provided by the patient.  Flank Pain Pertinent negatives include no chest pain, no abdominal pain, no headaches and no shortness of breath.       Home Medications Prior to Admission medications   Medication Sig Start Date End Date Taking? Authorizing Provider  busPIRone (BUSPAR) 5 MG tablet Take 1 tablet (5 mg total) by mouth daily. 07/25/23   Wallis Bamberg, PA-C  hydrOXYzine (ATARAX) 25 MG tablet Take 0.5-1 tablets (12.5-25 mg total) by mouth every 8 (eight) hours as needed for anxiety. 07/25/23   Wallis Bamberg, PA-C      Allergies    Penicillins    Review of Systems   Review of Systems  Constitutional:  Negative for activity change, appetite change and fever.  HENT:  Negative for congestion and rhinorrhea.   Eyes:  Negative for visual disturbance.  Respiratory:  Negative for cough, chest tightness and shortness of breath.   Cardiovascular:  Negative for chest pain.  Gastrointestinal:  Negative for abdominal pain, nausea and vomiting.  Genitourinary:  Positive for flank pain. Negative for dysuria and hematuria.  Musculoskeletal:  Positive for back pain. Negative for arthralgias and myalgias.   Skin:  Negative for rash.  Neurological:  Negative for dizziness, weakness and headaches.   all other systems are negative except as noted in the HPI and PMH.    Physical Exam Updated Vital Signs BP 124/80   Pulse 90   Temp 98.1 F (36.7 C) (Oral)   Resp 20   Ht 5\' 1"  (1.549 m)   Wt 93 kg   LMP 07/18/2023 (Exact Date)   SpO2 100%   BMI 38.73 kg/m  Physical Exam Vitals and nursing note reviewed.  Constitutional:      General: She is not in acute distress.    Appearance: She is well-developed.  HENT:     Head: Normocephalic and atraumatic.     Mouth/Throat:     Pharynx: No oropharyngeal exudate.  Eyes:     Conjunctiva/sclera: Conjunctivae normal.     Pupils: Pupils are equal, round, and reactive to light.  Neck:     Comments: No meningismus. Cardiovascular:     Rate and Rhythm: Normal rate and regular rhythm.     Heart sounds: Normal heart sounds. No murmur heard. Pulmonary:     Effort: Pulmonary effort is normal. No respiratory distress.     Breath sounds: Normal breath sounds.  Abdominal:     Palpations: Abdomen is soft.     Tenderness: There is no abdominal tenderness. There is no guarding or rebound.  Musculoskeletal:        General: Tenderness present. Normal  range of motion.     Cervical back: Normal range of motion and neck supple.     Comments: Bilateral CVA tenderness  5/5 strength in bilateral lower extremities. Ankle plantar and dorsiflexion intact. Great toe extension intact bilaterally. +2 DP and PT pulses. +2 patellar reflexes bilaterally. Normal gait.   Skin:    General: Skin is warm.  Neurological:     Mental Status: She is alert and oriented to person, place, and time.     Cranial Nerves: No cranial nerve deficit.     Motor: No abnormal muscle tone.     Coordination: Coordination normal.     Comments:  5/5 strength throughout. CN 2-12 intact.Equal grip strength.   Psychiatric:        Behavior: Behavior normal.     ED Results / Procedures /  Treatments   Labs (all labs ordered are listed, but only abnormal results are displayed) Labs Reviewed  CBC WITH DIFFERENTIAL/PLATELET - Abnormal; Notable for the following components:      Result Value   Hemoglobin 10.2 (*)    HCT 33.5 (*)    MCV 79.6 (*)    MCH 24.2 (*)    RDW 15.7 (*)    All other components within normal limits  BASIC METABOLIC PANEL WITH GFR - Abnormal; Notable for the following components:   Potassium 3.3 (*)    Calcium 8.3 (*)    All other components within normal limits  URINALYSIS, ROUTINE W REFLEX MICROSCOPIC  PREGNANCY, URINE    EKG None  Radiology CT Renal Stone Study Result Date: 08/12/2023 CLINICAL DATA:  Bilateral flank and back pain for 3 days. EXAM: CT ABDOMEN AND PELVIS WITHOUT CONTRAST TECHNIQUE: Multidetector CT imaging of the abdomen and pelvis was performed following the standard protocol without IV contrast. RADIATION DOSE REDUCTION: This exam was performed according to the departmental dose-optimization program which includes automated exposure control, adjustment of the mA and/or kV according to patient size and/or use of iterative reconstruction technique. COMPARISON:  CT abdomen pelvis 06/23/2022 FINDINGS: Lower chest: No acute abnormality. Hepatobiliary: Unremarkable liver. Cholecystectomy. No biliary dilation. Pancreas: Unremarkable. Spleen: Unremarkable. Adrenals/Urinary Tract: Stable adrenal glands. No urinary calculi or hydronephrosis. Bladder is unremarkable. Stomach/Bowel: Normal caliber large and small bowel. No bowel wall thickening. The appendix is normal.Stomach is within normal limits. Vascular/Lymphatic: No significant vascular findings are present. No enlarged abdominal or pelvic lymph nodes. Reproductive: Unremarkable. Other: No free intraperitoneal fluid or air. Musculoskeletal: No acute fracture. IMPRESSION: No acute abnormality in the abdomen or pelvis. Electronically Signed   By: Minerva Fester M.D.   On: 08/12/2023 02:55     Procedures Procedures    Medications Ordered in ED Medications  ketorolac (TORADOL) 30 MG/ML injection 30 mg (has no administration in time range)    ED Course/ Medical Decision Making/ A&P                                 Medical Decision Making Amount and/or Complexity of Data Reviewed Labs: ordered. Decision-making details documented in ED Course. Radiology: ordered and independent interpretation performed. Decision-making details documented in ED Course. ECG/medicine tests: ordered and independent interpretation performed. Decision-making details documented in ED Course.  Risk Prescription drug management.   History of kidney stones and UTI here with bilateral flank pain but denies any UTI symptoms.  No fever.  Vital stable.  Abdomen soft without peritoneal signs.  Intact distal strength, sensation, pulses and  reflexes. Low suspicion for cord compression or cauda equina.  Urinalysis is negative. CT scan negative for kidney stone or other acute pathology.  No aneurysm. Labs with stable anemia and kidney function.  Favor passed kidney stone versus musculoskeletal back pain.  Will treat supportively with anti-inflammatories.  Low suspicion for cord compression or cauda equina.  Consider passed kidney stone.  Follow-up with PCP.  Return to the ED with new or worsening symptoms.        Final Clinical Impression(s) / ED Diagnoses Final diagnoses:  None    Rx / DC Orders ED Discharge Orders     None         Jaja Switalski, Jeannett Senior, MD 08/12/23 [phone]

## 2023-09-13 ENCOUNTER — Encounter (HOSPITAL_BASED_OUTPATIENT_CLINIC_OR_DEPARTMENT_OTHER): Payer: Self-pay

## 2023-09-13 ENCOUNTER — Emergency Department (HOSPITAL_BASED_OUTPATIENT_CLINIC_OR_DEPARTMENT_OTHER): Payer: Self-pay | Admitting: Radiology

## 2023-09-13 ENCOUNTER — Emergency Department (HOSPITAL_BASED_OUTPATIENT_CLINIC_OR_DEPARTMENT_OTHER)
Admission: EM | Admit: 2023-09-13 | Discharge: 2023-09-13 | Disposition: A | Discharge status: Home / Self Care | Payer: Self-pay | Attending: Emergency Medicine | Admitting: Emergency Medicine

## 2023-09-13 ENCOUNTER — Other Ambulatory Visit: Payer: Self-pay

## 2023-09-13 DIAGNOSIS — J069 Acute upper respiratory infection, unspecified: Secondary | ICD-10-CM | POA: Insufficient documentation

## 2023-09-13 LAB — RESP PANEL BY RT-PCR (RSV, FLU A&B, COVID)  RVPGX2
Influenza A by PCR: NEGATIVE
Influenza B by PCR: NEGATIVE
Resp Syncytial Virus by PCR: NEGATIVE
SARS Coronavirus 2 by RT PCR: NEGATIVE

## 2023-09-13 MED ORDER — BENZONATATE 100 MG PO CAPS: 100.0000 mg | ORAL_CAPSULE | Freq: Three times a day (TID) | ORAL | 0 refills | Status: AC

## 2023-09-13 MED ORDER — BENZONATATE 100 MG PO CAPS
100.0000 mg | ORAL_CAPSULE | Freq: Once | ORAL | Status: AC
  Administered 2023-09-13: 100 mg via ORAL
  Filled 2023-09-13: qty 1

## 2023-09-13 NOTE — ED Triage Notes (Signed)
 Patient reports cough, shortness of breath, and body aches. States she's felt hot but does not think she has had a fever. Patient took ibuprofen  at 4am. Patient reports symptoms Wednesday. Report that kids have recently been sick with similar symptoms.

## 2023-09-13 NOTE — ED Provider Notes (Signed)
 [place] EMERGENCY DEPARTMENT AT [facility] Provider Note   CSN: [id number] Arrival date & time: 09/13/23  7829     History  Chief Complaint  Patient presents with   Generalized Body Aches    Nancy Palmer is a 38 y.o. female.  HPI   38 year old female presenting to the emerged part with URI symptoms.  The patient states that her kids were sick at the house with similar symptoms.  She states that since Wednesday she has had a cough, shortness of breath, generalized bodyaches and fatigue.  She denies any known fevers, has felt subjectively warm.  She took ibuprofen  at 4 AM prior to arrival.  The patient denies any chest pain, denies any abdominal pain, is tolerating oral intake.  Home Medications Prior to Admission medications   Medication Sig Start Date End Date Taking? Authorizing Provider  benzonatate  (TESSALON ) 100 MG capsule Take 1 capsule (100 mg total) by mouth every 8 (eight) hours. 09/13/23  Yes Rosealee Concha, MD  busPIRone  (BUSPAR ) 5 MG tablet Take 1 tablet (5 mg total) by mouth daily. 07/25/23   Adolph Hoop, PA-C  hydrOXYzine  (ATARAX ) 25 MG tablet Take 0.5-1 tablets (12.5-25 mg total) by mouth every 8 (eight) hours as needed for anxiety. 07/25/23   Adolph Hoop, PA-C  naproxen  (NAPROSYN ) 500 MG tablet Take 1 tablet (500 mg total) by mouth 2 (two) times daily as needed. 08/12/23   Rancour, Mara [place], MD      Allergies    Penicillins    Review of Systems   Review of Systems  All other systems reviewed and are negative.   Physical Exam Updated Vital Signs BP 111/70 (BP Location: Right Arm)   Pulse 100   Temp 98.5 F (36.9 C)   Resp 20   Ht 5\' 1"  (1.549 m)   Wt 90.7 kg   SpO2 100%   BMI 37.79 kg/m  Physical Exam Vitals and nursing note reviewed.  Constitutional:      General: She is not in acute distress.    Appearance: She is well-developed.  HENT:     Head: Normocephalic and atraumatic.     Mouth/Throat:     Pharynx: Posterior oropharyngeal  erythema present. No oropharyngeal exudate.  Eyes:     Conjunctiva/sclera: Conjunctivae normal.  Cardiovascular:     Rate and Rhythm: Normal rate and regular rhythm.  Pulmonary:     Effort: Pulmonary effort is normal. No respiratory distress.     Breath sounds: Normal breath sounds.  Abdominal:     Palpations: Abdomen is soft.     Tenderness: There is no abdominal tenderness.  Musculoskeletal:        General: No swelling.     Cervical back: Neck supple.  Skin:    General: Skin is warm and dry.     Capillary Refill: Capillary refill takes less than 2 seconds.  Neurological:     Mental Status: She is alert.  Psychiatric:        Mood and Affect: Mood normal.     ED Results / Procedures / Treatments   Labs (all labs ordered are listed, but only abnormal results are displayed) Labs Reviewed  RESP PANEL BY RT-PCR (RSV, FLU A&B, COVID)  [id number]    EKG None  Radiology No results found.  Procedures Procedures    Medications Ordered in ED Medications  benzonatate  (TESSALON ) capsule 100 mg (has no administration in time range)    ED Course/ Medical Decision Making/ A&P  Medical Decision Making Amount and/or Complexity of Data Reviewed Radiology: ordered.  Risk Prescription drug management.    38 year old female presenting to the emerged part with URI symptoms.  The patient states that her kids were sick at the house with similar symptoms.  She states that since Wednesday she has had a cough, shortness of breath, generalized bodyaches and fatigue.  She denies any known fevers, has felt subjectively warm.  She took ibuprofen  at 4 AM prior to arrival.  The patient denies any chest pain, denies any abdominal pain, is tolerating oral intake.  Nancy Palmer is a 38 y.o. female who presents to the ED with a 3 day history of fever, cough and body aches.  On my exam, the patient is well-appearing and well-hydrated.  The patient's lungs are clear  to auscultation bilaterally. Additionally, the patient has a soft/non-tender abdomen, and no oropharyngeal exudates.  There are no signs of meningismus.  I see no signs of an acute bacterial infection.  The patient's presentation is most consistent with a viral upper respiratory infection.  I have a low suspicion for pneumonia as the patient's cough has been non-productive and the patient is neither tachypneic nor hypoxic on room air.  Additionally, the patient is CTAB.  Labs: COVID, flu, RSV PCR testing was collected and resulted negative.  Patient was offered Tessalon  for symptomatic management.  CXR: Lungs clear on my read IMPRESSION:  No active cardiopulmonary disease.    I discussed symptomatic management, including hydration, motrin , and tylenol . The patient felt safe being discharged from the ED.  They agreed to followup with the PCP if needed.  I provided ED return precautions.   Final Clinical Impression(s) / ED Diagnoses Final diagnoses:  Upper respiratory tract infection, unspecified type    Rx / DC Orders ED Discharge Orders          Ordered    benzonatate  (TESSALON ) 100 MG capsule  Every 8 hours        09/13/23 0644              Rosealee Concha, MD 09/13/23 2254

## 2023-09-13 NOTE — Discharge Instructions (Addendum)
 Continue to push oral fluid resuscitation, recommend Tylenol  and ibuprofen  for muscle aches and fever, COVID, flu, RSV PCR testing resulted negative, chest x-ray revealed clear lungs.

## 2023-10-27 ENCOUNTER — Encounter (HOSPITAL_BASED_OUTPATIENT_CLINIC_OR_DEPARTMENT_OTHER): Payer: Self-pay

## 2023-10-27 ENCOUNTER — Emergency Department (HOSPITAL_BASED_OUTPATIENT_CLINIC_OR_DEPARTMENT_OTHER)
Admission: EM | Admit: 2023-10-27 | Discharge: 2023-10-27 | Disposition: A | Discharge status: Home / Self Care | Payer: Self-pay | Attending: Emergency Medicine | Admitting: Emergency Medicine

## 2023-10-27 ENCOUNTER — Other Ambulatory Visit: Payer: Self-pay

## 2023-10-27 DIAGNOSIS — X58XXXA Exposure to other specified factors, initial encounter: Secondary | ICD-10-CM | POA: Insufficient documentation

## 2023-10-27 DIAGNOSIS — S39012A Strain of muscle, fascia and tendon of lower back, initial encounter: Secondary | ICD-10-CM | POA: Insufficient documentation

## 2023-10-27 LAB — HCG, SERUM, QUALITATIVE: Preg, Serum: NEGATIVE

## 2023-10-27 MED ORDER — LIDOCAINE 5 % EX PTCH
1.0000 | MEDICATED_PATCH | CUTANEOUS | 0 refills | Status: AC
Start: 2023-10-27 — End: ?

## 2023-10-27 MED ORDER — KETOROLAC TROMETHAMINE 15 MG/ML IJ SOLN
15.0000 mg | Freq: Once | INTRAMUSCULAR | Status: AC
Start: 2023-10-27 — End: 2023-10-27
  Administered 2023-10-27: 15 mg via INTRAVENOUS
  Filled 2023-10-27: qty 1

## 2023-10-27 MED ORDER — CYCLOBENZAPRINE HCL 5 MG PO TABS
5.0000 mg | ORAL_TABLET | Freq: Once | ORAL | Status: AC
  Administered 2023-10-27: 5 mg via ORAL
  Filled 2023-10-27: qty 1

## 2023-10-27 MED ORDER — HYDROCODONE-ACETAMINOPHEN 5-325 MG PO TABS
1.0000 | ORAL_TABLET | Freq: Once | ORAL | Status: AC
  Administered 2023-10-27: 1 via ORAL
  Filled 2023-10-27: qty 1

## 2023-10-27 MED ORDER — LIDOCAINE 5 % EX PTCH
1.0000 | MEDICATED_PATCH | CUTANEOUS | Status: DC
  Administered 2023-10-27: 1 via TRANSDERMAL
  Filled 2023-10-27: qty 1

## 2023-10-27 MED ORDER — OXYCODONE HCL 5 MG PO TABS: 5.0000 mg | ORAL_TABLET | Freq: Four times a day (QID) | ORAL | 0 refills | Status: DC | PRN

## 2023-10-27 MED ORDER — FENTANYL CITRATE PF 50 MCG/ML IJ SOSY
25.0000 ug | PREFILLED_SYRINGE | Freq: Once | INTRAMUSCULAR | Status: AC
  Administered 2023-10-27: 25 ug via INTRAVENOUS
  Filled 2023-10-27: qty 1

## 2023-10-27 MED ORDER — PREDNISONE 20 MG PO TABS
40.0000 mg | ORAL_TABLET | Freq: Every day | ORAL | 0 refills | Status: DC
Start: 2023-10-27 — End: 2023-10-27

## 2023-10-27 MED ORDER — LIDOCAINE 5 % EX PTCH: 1.0000 | MEDICATED_PATCH | CUTANEOUS | 0 refills | Status: DC

## 2023-10-27 MED ORDER — PREDNISONE 20 MG PO TABS: 40.0000 mg | ORAL_TABLET | Freq: Every day | ORAL | 0 refills | Status: DC

## 2023-10-27 MED ORDER — METHOCARBAMOL 500 MG PO TABS: 1000.0000 mg | ORAL_TABLET | Freq: Once | ORAL | Status: DC

## 2023-10-27 NOTE — ED Triage Notes (Addendum)
 Pt c/o severe R sided back pain. Seen for same last night, dx w pulled muscle, but the meds aren't working. I almost fell to the floor trying to get to the bathroom, took another muscle relaxer about 2hrs ago  Workup for kidney stone negative

## 2023-10-27 NOTE — ED Provider Notes (Signed)
 [place] [facility] AT [facility] Provider Note   CSN: [id number] Arrival date & time: 10/27/23  1508     Patient presents with: Back Pain   Nancy Palmer is a 38 y.o. female no significant past medical history reporting to emergency room with complaint of back pain.  Patient reports that last night she started having severe right-sided low back pain.  After this started she was seen in [facility] ED and had imaging, was told that her workup was normal.  Today she took ibuprofen  around 1 PM and Robaxin without any relief of symptoms.  She denies any radicular symptoms.  No saddle anesthesia or loss of bowel or bladder.  She has no history of cancer or IV drug use.  She has not had any fever with this.  Denies any urinary symptoms. Denies injury, trauma or fall. Denies change in symptoms since prior recent imaging.     Back Pain      Prior to Admission medications   Medication Sig Start Date End Date Taking? Authorizing Provider  benzonatate  (TESSALON ) 100 MG capsule Take 1 capsule (100 mg total) by mouth every 8 (eight) hours. 09/13/23   Rosealee Concha, MD  busPIRone  (BUSPAR ) 5 MG tablet Take 1 tablet (5 mg total) by mouth daily. 07/25/23   Adolph Hoop, PA-C  hydrOXYzine  (ATARAX ) 25 MG tablet Take 0.5-1 tablets (12.5-25 mg total) by mouth every 8 (eight) hours as needed for anxiety. 07/25/23   Adolph Hoop, PA-C  naproxen  (NAPROSYN ) 500 MG tablet Take 1 tablet (500 mg total) by mouth 2 (two) times daily as needed. 08/12/23   Earma Gloss, MD    Allergies: Penicillins    Review of Systems  Musculoskeletal:  Positive for back pain.    Updated Vital Signs BP 129/76 (BP Location: Right Arm)   Pulse 96   Temp 97.9 F (36.6 C)   Resp 20   SpO2 99%   Physical Exam Vitals and nursing note reviewed.  Constitutional:      General: She is not in acute distress.    Appearance: She is not toxic-appearing.  HENT:     Head: Normocephalic and atraumatic.   Eyes:      General: No scleral icterus.    Conjunctiva/sclera: Conjunctivae normal.    Cardiovascular:     Rate and Rhythm: Normal rate and regular rhythm.     Pulses: Normal pulses.     Heart sounds: Normal heart sounds.  Pulmonary:     Effort: Pulmonary effort is normal. No respiratory distress.     Breath sounds: Normal breath sounds.  Abdominal:     General: Abdomen is flat. Bowel sounds are normal.     Palpations: Abdomen is soft.     Tenderness: There is no abdominal tenderness.   Musculoskeletal:     Comments: Right paravertebral TPP, no midline tenderness, step-off or deformity.   Skin:    General: Skin is warm and dry.     Findings: No lesion.   Neurological:     General: No focal deficit present.     Mental Status: She is alert and oriented to person, place, and time. Mental status is at baseline.     Comments: Patient ambulatory in room. Lower extremity sensation and strength intact.    (all labs ordered are listed, but only abnormal results are displayed) Labs Reviewed  HCG, SERUM, QUALITATIVE    EKG: None  Radiology: No results found.   Procedures   Medications Ordered in the ED  fentaNYL  (SUBLIMAZE ) injection 25 mcg (has no administration in time range)  ketorolac  (TORADOL ) 15 MG/ML injection 15 mg (has no administration in time range)    Clinical Course as of 10/27/23 2040  Mon Oct 27, 2023  1926 Reassessed.  She is feeling better able to ambulate although with discomfort.  Requesting discharge. [JB]    Clinical Course User Index [JB] Leeanne Butters, Kandace Organ, PA-C                                 Medical Decision Making Amount and/or Complexity of Data Reviewed Labs: ordered.  Risk Prescription drug management.   Darrall Ellison 38 y.o. presented today for back pain. Working Ddx: MSK in nature, fracture, epidural hematoma/abscess, cauda equina syndrome, spinal stenosis, spinal malignancy, discitis, spinal infection, spondylitises/ spondylosis, conus  medullaris, DDD of the back.  R/o DDx: Cauda equina syndrome and additional dx are less likely than current impression due to history of present illness, physical exam, labs/imaging findings. No focal neurological deficits, no loss of bowel or bladder control.  Denies fever, night sweats, weight loss, h/o cancer, IVDU.    Review of prior external notes: Unable to review recent ED visit in care everywhere.   Problem List / ED Course / Critical interventions / Medication management  She presents emergency room with complaint of low back pain.  This is right paravertebral musculature.  It is worse when moving and reproducible on exam.  She has no lumbar midline tenderness step-off or deformity.  She denies any radicular symptoms.  She has no red flag symptoms associate with her back pain.  She is hemodynamically stable and well-appearing.  Given no injury trauma or fall and imaging done yesterday do not feel that it needs to be repeated today in emergency room.  She is not having any associated dysuria, abdominal pain nausea to suggest this is abdominal in origin.  Feel most likely muscular in nature likely consistent with lumbar strain.  Will give pain control here and sent home with better pain control and PCP follow up.  I ordered medication including toradol   Reevaluation of the patient after these medicines showed that the patient improved Patients vitals assessed. Upon arrival patient is  hemodynamically stable.  I have reviewed the patients home medicines and have made adjustments as needed    Plan: F/u w/ PCP in 2-3d to ensure resolution of sx.  RICE protocol and pain medicine discussed with patient.  Patient was given return precautions. Patient stable for discharge at this time.  Patient educated on sx/dx and verbalized understanding of plan. Return to ER with new or worsening sx.       Final diagnoses:  Strain of lumbar region, initial encounter    ED Discharge Orders           Ordered    lidocaine  (LIDODERM ) 5 %  Every 24 hours,   Status:  Discontinued        10/27/23 2019    predniSONE  (DELTASONE ) 20 MG tablet  Daily,   Status:  Discontinued        10/27/23 2019    oxyCODONE  (ROXICODONE ) 5 MG immediate release tablet  Every 6 hours PRN,   Status:  Discontinued        10/27/23 2019    lidocaine  (LIDODERM ) 5 %  Every 24 hours        10/27/23 2025    oxyCODONE  (ROXICODONE ) 5 MG immediate  release tablet  Every 6 hours PRN        10/27/23 2025    predniSONE  (DELTASONE ) 20 MG tablet  Daily        10/27/23 2025               Levonia Wolfley, Kandace Organ, PA-C 10/27/23 2041    Sallyanne Creamer, DO 10/28/23 0038

## 2023-10-27 NOTE — Discharge Instructions (Addendum)
 Follow-up with primary care.  Take steroids starting tomorrow for 5 days. You can take 1000 mg of Tylenol  every 8 hours.  Use Robaxin as needed for muscle spasm.  Use lidocaine  patch every 12 hours as well as heat and ice. Take oxycodone  for breakthrough pain.  Follow-up with primary care.  Return to ER with new or worsening symptoms.

## 2023-10-29 ENCOUNTER — Telehealth (HOSPITAL_BASED_OUTPATIENT_CLINIC_OR_DEPARTMENT_OTHER): Payer: Self-pay | Admitting: Emergency Medicine

## 2023-10-29 MED ORDER — METHOCARBAMOL 1000 MG PO TABS: 1000.0000 mg | ORAL_TABLET | Freq: Three times a day (TID) | ORAL | 0 refills | Status: DC | PRN

## 2023-10-29 NOTE — Telephone Encounter (Signed)
 Patient is asking for e-prescription of her Robaxin that was supposed to be prescribed.

## 2024-02-26 ENCOUNTER — Other Ambulatory Visit: Payer: Self-pay | Admitting: Medical Genetics

## 2024-02-26 DIAGNOSIS — Z006 Encounter for examination for normal comparison and control in clinical research program: Secondary | ICD-10-CM

## 2024-05-05 ENCOUNTER — Encounter (HOSPITAL_BASED_OUTPATIENT_CLINIC_OR_DEPARTMENT_OTHER): Payer: Self-pay | Admitting: Emergency Medicine

## 2024-05-05 ENCOUNTER — Emergency Department (HOSPITAL_BASED_OUTPATIENT_CLINIC_OR_DEPARTMENT_OTHER)
Admission: EM | Admit: 2024-05-05 | Discharge: 2024-05-05 | Disposition: A | Discharge status: Home / Self Care | Payer: Self-pay | Attending: Emergency Medicine | Admitting: Emergency Medicine

## 2024-05-05 ENCOUNTER — Emergency Department (HOSPITAL_BASED_OUTPATIENT_CLINIC_OR_DEPARTMENT_OTHER): Payer: Self-pay | Admitting: Radiology

## 2024-05-05 ENCOUNTER — Other Ambulatory Visit: Payer: Self-pay

## 2024-05-05 DIAGNOSIS — D649 Anemia, unspecified: Secondary | ICD-10-CM | POA: Insufficient documentation

## 2024-05-05 DIAGNOSIS — R0789 Other chest pain: Secondary | ICD-10-CM | POA: Insufficient documentation

## 2024-05-05 DIAGNOSIS — M546 Pain in thoracic spine: Secondary | ICD-10-CM | POA: Insufficient documentation

## 2024-05-05 LAB — CBC
HCT: 33.6 % — ABNORMAL LOW (ref 36.0–46.0)
Hemoglobin: 10.3 g/dL — ABNORMAL LOW (ref 12.0–15.0)
MCH: 25.4 pg — ABNORMAL LOW (ref 26.0–34.0)
MCHC: 30.7 g/dL (ref 30.0–36.0)
MCV: 83 fL (ref 80.0–100.0)
Platelets: 249 K/uL (ref 150–400)
RBC: 4.05 MIL/uL (ref 3.87–5.11)
RDW: 15.5 % (ref 11.5–15.5)
WBC: 7.4 K/uL (ref 4.0–10.5)
nRBC: 0 % (ref 0.0–0.2)

## 2024-05-05 LAB — BASIC METABOLIC PANEL WITH GFR
Anion gap: 10 (ref 5–15)
BUN: 8 mg/dL (ref 6–20)
CO2: 25 mmol/L (ref 22–32)
Calcium: 9.1 mg/dL (ref 8.9–10.3)
Chloride: 105 mmol/L (ref 98–111)
Creatinine, Ser: 0.87 mg/dL (ref 0.44–1.00)
GFR, Estimated: >60 mL/min
Glucose, Bld: 93 mg/dL (ref 70–99)
Potassium: 3.6 mmol/L (ref 3.5–5.1)
Sodium: 140 mmol/L (ref 135–145)

## 2024-05-05 LAB — TROPONIN T, HIGH SENSITIVITY: Troponin T High Sensitivity: <15 ng/L (ref 0–19)

## 2024-05-05 LAB — HCG, SERUM, QUALITATIVE: Preg, Serum: NEGATIVE

## 2024-05-05 MED ORDER — METHOCARBAMOL 500 MG PO TABS: 500.0000 mg | ORAL_TABLET | Freq: Two times a day (BID) | ORAL | 0 refills | Status: AC

## 2024-05-05 MED ORDER — NAPROXEN 500 MG PO TABS: 500.0000 mg | ORAL_TABLET | Freq: Two times a day (BID) | ORAL | 0 refills | Status: AC

## 2024-05-05 MED ORDER — KETOROLAC TROMETHAMINE 30 MG/ML IJ SOLN
30.0000 mg | Freq: Once | INTRAMUSCULAR | Status: AC
  Administered 2024-05-05: 30 mg via INTRAMUSCULAR
  Filled 2024-05-05: qty 1

## 2024-05-05 NOTE — ED Triage Notes (Signed)
 Chest pain all day Back pain ( center upper), pain in back when taking deep breathe

## 2024-05-05 NOTE — ED Provider Notes (Signed)
 "  [place] EM[facility] DEPARTMENT AT [facility]  Provider Note  CSN: [id number] Arrival date & time: 05/05/24 2125  History Chief Complaint  Patient presents with   Chest Pain    Nancy Palmer is a 38 y.o. female with no significant PMH reports onset of aching chest pain around lunchtime today, not associated with cough, SOB or fever. No N/V or diaphoresis. Pain does not radiate into arm or jaw. She reports some pain in her mid back, but feels these are two separate pains. She has family history of CAD, does not smoke.    Home Medications Prior to Admission medications  Medication Sig Start Date End Date Taking? Authorizing Provider  methocarbamol  (ROBAXIN ) 500 MG tablet Take 1 tablet (500 mg total) by mouth 2 (two) times daily. 05/05/24  Yes Roselyn Carlin NOVAK, MD  naproxen  (NAPROSYN ) 500 MG tablet Take 1 tablet (500 mg total) by mouth 2 (two) times daily. 05/05/24  Yes Roselyn Carlin NOVAK, MD  benzonatate  (TESSALON ) 100 MG capsule Take 1 capsule (100 mg total) by mouth every 8 (eight) hours. 09/13/23   Jerrol Agent, MD  busPIRone  (BUSPAR ) 5 MG tablet Take 1 tablet (5 mg total) by mouth daily. 07/25/23   Christopher Savannah, PA-C  hydrOXYzine  (ATARAX ) 25 MG tablet Take 0.5-1 tablets (12.5-25 mg total) by mouth every 8 (eight) hours as needed for anxiety. 07/25/23   Christopher Savannah, PA-C  lidocaine  (LIDODERM ) 5 % Place 1 patch onto the skin daily. Remove & Discard patch within 12 hours or as directed by MD 10/27/23   Barrett, Jamie N, PA-C     Allergies    Penicillins   Review of Systems   Review of Systems Please see HPI for pertinent positives and negatives  Physical Exam BP 134/67   Pulse 75   Temp 97.9 F (36.6 C)   Resp 18   LMP 05/02/2024   SpO2 100%   Physical Exam Vitals and nursing note reviewed.  Constitutional:      Appearance: Normal appearance.  HENT:     Head: Normocephalic and atraumatic.     Nose: Nose normal.     Mouth/Throat:     Mouth: Mucous membranes  are moist.  Eyes:     Extraocular Movements: Extraocular movements intact.     Conjunctiva/sclera: Conjunctivae normal.  Cardiovascular:     Rate and Rhythm: Normal rate.  Pulmonary:     Effort: Pulmonary effort is normal.     Breath sounds: Normal breath sounds.  Chest:     Chest wall: No tenderness.  Abdominal:     General: Abdomen is flat.     Palpations: Abdomen is soft.     Tenderness: There is no abdominal tenderness.  Musculoskeletal:        General: No swelling. Normal range of motion.     Cervical back: Neck supple.     Right lower leg: No edema.     Left lower leg: No edema.     Comments: Mild tenderness to thoracic paraspinal muscles  Skin:    General: Skin is warm and dry.  Neurological:     General: No focal deficit present.     Mental Status: She is alert.  Psychiatric:        Mood and Affect: Mood normal.     ED Results / Procedures / Treatments   EKG EKG Interpretation Date/Time:  Wednesday May 05 2024 21:38:37 EST Ventricular Rate:  69 PR Interval:  172 QRS Duration:  70 QT  Interval:  364 QTC Calculation: 390 R Axis:   30  Text Interpretation: Normal sinus rhythm Low voltage QRS Cannot rule out Anterior infarct , age undetermined Abnormal ECG When compared with ECG of 14-Mar-2023 05:36, PREVIOUS ECG IS PRESENT QRS voltages are lower Confirmed by Roselyn Dunnings [phone]) on 05/05/2024 10:53:51 PM  Procedures Procedures  Medications Ordered in the ED Medications  ketorolac  (TORADOL ) 30 MG/ML injection 30 mg (has no administration in time range)    Initial Impression and Plan  Patient here with atypical chest pain, low risk factor profile for CAD. Back pain seems more MSK and not related. She had labs done in triage showing CBC with mild anemia unchanged frm baseline. BMP, trop and HCG are neg. I personally viewed the images from radiology studies and agree with radiologist interpretation: CXR is clear. Given duration of symptoms and low risk  factor profile, do not need delta troponin to rule out AMI. Will give a dose of toradol  for pain, Rx for naprosyn , Robaxin  and recommend rest. PCP follow up, RTED for any other concerns.    ED Course       MDM Rules/Calculators/A&P Medical Decision Making Problems Addressed: Acute bilateral thoracic back pain: acute illness or injury Atypical chest pain: acute illness or injury  Amount and/or Complexity of Data Reviewed Labs: ordered. Decision-making details documented in ED Course. Radiology: ordered and independent interpretation performed. Decision-making details documented in ED Course. ECG/medicine tests: ordered and independent interpretation performed. Decision-making details documented in ED Course.  Risk Prescription drug management.     Final Clinical Impression(s) / ED Diagnoses Final diagnoses:  Atypical chest pain  Acute bilateral thoracic back pain    Rx / DC Orders ED Discharge Orders          Ordered    naproxen  (NAPROSYN ) 500 MG tablet  2 times daily        05/05/24 2341    methocarbamol  (ROBAXIN ) 500 MG tablet  2 times daily        05/05/24 2341             Roselyn Dunnings NOVAK, MD 05/05/24 2342  "
